# Patient Record
Sex: Female | Born: 1945 | Race: White | Hispanic: No | Marital: Married | Smoking: Former smoker
Health system: Southern US, Community
[De-identification: ages and names within clinical notes are randomized; demographics above are authoritative.]

---

## 1997-09-08 ENCOUNTER — Ambulatory Visit (HOSPITAL_COMMUNITY): Admission: RE | Admit: 1997-09-08 | Discharge: 1997-09-08 | Payer: Self-pay | Admitting: Family Medicine

## 2000-05-03 ENCOUNTER — Ambulatory Visit (HOSPITAL_COMMUNITY): Admission: RE | Admit: 2000-05-03 | Discharge: 2000-05-03 | Payer: Self-pay | Admitting: Family Medicine

## 2000-05-03 ENCOUNTER — Encounter: Payer: Self-pay | Admitting: Family Medicine

## 2002-10-08 ENCOUNTER — Encounter: Payer: Self-pay | Admitting: Family Medicine

## 2002-10-08 ENCOUNTER — Encounter: Admission: RE | Admit: 2002-10-08 | Discharge: 2002-10-08 | Payer: Self-pay | Admitting: Family Medicine

## 2003-08-04 ENCOUNTER — Other Ambulatory Visit: Admission: RE | Admit: 2003-08-04 | Discharge: 2003-08-04 | Payer: Self-pay | Admitting: Obstetrics and Gynecology

## 2005-02-06 ENCOUNTER — Encounter: Admission: RE | Admit: 2005-02-06 | Discharge: 2005-02-06 | Payer: Self-pay | Admitting: Family Medicine

## 2005-02-21 ENCOUNTER — Encounter: Admission: RE | Admit: 2005-02-21 | Discharge: 2005-02-21 | Payer: Self-pay | Admitting: Family Medicine

## 2007-05-13 ENCOUNTER — Encounter: Admission: RE | Admit: 2007-05-13 | Discharge: 2007-05-13 | Payer: Self-pay | Admitting: Family Medicine

## 2008-07-03 ENCOUNTER — Ambulatory Visit (HOSPITAL_BASED_OUTPATIENT_CLINIC_OR_DEPARTMENT_OTHER): Admission: RE | Admit: 2008-07-03 | Discharge: 2008-07-03 | Payer: Self-pay | Admitting: Family Medicine

## 2008-07-03 ENCOUNTER — Ambulatory Visit: Payer: Self-pay | Admitting: Diagnostic Radiology

## 2009-08-25 ENCOUNTER — Ambulatory Visit: Payer: Self-pay | Admitting: Radiology

## 2009-08-25 ENCOUNTER — Ambulatory Visit (HOSPITAL_BASED_OUTPATIENT_CLINIC_OR_DEPARTMENT_OTHER): Admission: RE | Admit: 2009-08-25 | Discharge: 2009-08-25 | Payer: Self-pay | Admitting: Family Medicine

## 2010-04-16 ENCOUNTER — Encounter: Payer: Self-pay | Admitting: Family Medicine

## 2011-07-03 ENCOUNTER — Other Ambulatory Visit: Payer: Self-pay | Admitting: Family Medicine

## 2011-07-03 DIAGNOSIS — Z1231 Encounter for screening mammogram for malignant neoplasm of breast: Secondary | ICD-10-CM

## 2011-07-04 ENCOUNTER — Ambulatory Visit
Admission: RE | Admit: 2011-07-04 | Discharge: 2011-07-04 | Disposition: A | Discharge status: Home / Self Care | Payer: BC Managed Care – PPO | Source: Ambulatory Visit | Attending: Family Medicine | Admitting: Family Medicine

## 2011-07-04 DIAGNOSIS — Z1231 Encounter for screening mammogram for malignant neoplasm of breast: Secondary | ICD-10-CM

## 2012-12-02 ENCOUNTER — Other Ambulatory Visit: Payer: Self-pay | Admitting: Family Medicine

## 2012-12-02 ENCOUNTER — Other Ambulatory Visit: Payer: Self-pay | Admitting: *Deleted

## 2012-12-02 DIAGNOSIS — N644 Mastodynia: Secondary | ICD-10-CM

## 2012-12-04 ENCOUNTER — Ambulatory Visit
Admission: RE | Admit: 2012-12-04 | Discharge: 2012-12-04 | Disposition: A | Discharge status: Home / Self Care | Payer: Medicare Other | Source: Ambulatory Visit | Attending: Family Medicine | Admitting: Family Medicine

## 2012-12-04 ENCOUNTER — Other Ambulatory Visit: Payer: Self-pay | Admitting: Family Medicine

## 2012-12-04 DIAGNOSIS — N644 Mastodynia: Secondary | ICD-10-CM

## 2015-08-05 DIAGNOSIS — I10 Essential (primary) hypertension: Secondary | ICD-10-CM

## 2015-08-05 DIAGNOSIS — R7303 Prediabetes: Secondary | ICD-10-CM

## 2015-08-05 DIAGNOSIS — F419 Anxiety disorder, unspecified: Secondary | ICD-10-CM

## 2015-08-05 HISTORY — DX: Essential (primary) hypertension: I10

## 2015-08-05 HISTORY — DX: Anxiety disorder, unspecified: F41.9

## 2015-08-05 HISTORY — DX: Prediabetes: R73.03

## 2016-01-31 DIAGNOSIS — K219 Gastro-esophageal reflux disease without esophagitis: Secondary | ICD-10-CM

## 2016-01-31 HISTORY — DX: Gastro-esophageal reflux disease without esophagitis: K21.9

## 2016-08-14 DIAGNOSIS — N183 Chronic kidney disease, stage 3 unspecified: Secondary | ICD-10-CM

## 2016-08-14 HISTORY — DX: Chronic kidney disease, stage 3 unspecified: N18.30

## 2016-08-28 DIAGNOSIS — E559 Vitamin D deficiency, unspecified: Secondary | ICD-10-CM

## 2016-08-28 HISTORY — DX: Vitamin D deficiency, unspecified: E55.9

## 2019-05-25 ENCOUNTER — Ambulatory Visit: Payer: Medicare Other | Attending: Internal Medicine

## 2019-05-25 DIAGNOSIS — Z23 Encounter for immunization: Secondary | ICD-10-CM | POA: Insufficient documentation

## 2019-05-25 NOTE — Progress Notes (Signed)
   Covid-19 Vaccination Clinic  Name:  Monica Rivers    MRN: [id number] DOB: 1946/02/12  05/25/2019  [name] was observed post Covid-19 immunization for 15 minutes without incidence. She was provided with Vaccine Information Sheet and instruction to access the V-Safe system.   [name] was instructed to call 911 with any severe reactions post vaccine: Marland Kitchen Difficulty breathing  . Swelling of your face and throat  . A fast heartbeat  . A bad rash all over your body  . Dizziness and weakness    Immunizations Administered    Name Date Dose VIS Date Route   Pfizer COVID-19 Vaccine 05/25/2019 11:58 AM 0.3 mL 03/07/2019 Intramuscular   Manufacturer: [place]   Lot: [id number]   [place]: [id number]

## 2019-06-24 ENCOUNTER — Ambulatory Visit: Payer: Medicare Other | Attending: Internal Medicine

## 2019-06-24 DIAGNOSIS — Z23 Encounter for immunization: Secondary | ICD-10-CM

## 2019-06-24 NOTE — Progress Notes (Signed)
   Covid-19 Vaccination Clinic  Name:  Monica Rivers    MRN: [id number] DOB: 11/19/1945  06/24/2019  [name] was observed post Covid-19 immunization for 15 minutes without incident. She was provided with Vaccine Information Sheet and instruction to access the V-Safe system.   [name] was instructed to call 911 with any severe reactions post vaccine: Marland Kitchen Difficulty breathing  . Swelling of face and throat  . A fast heartbeat  . A bad rash all over body  . Dizziness and weakness   Immunizations Administered    Name Date Dose VIS Date Route   Pfizer COVID-19 Vaccine 06/24/2019 12:08 PM 0.3 mL 03/07/2019 Intramuscular   Manufacturer: [place]   Lot: [id number]   [place]: [id number]

## 2020-01-05 DIAGNOSIS — Z5329 Procedure and treatment not carried out because of patient's decision for other reasons: Secondary | ICD-10-CM | POA: Insufficient documentation

## 2020-01-05 DIAGNOSIS — Z5320 Procedure and treatment not carried out because of patient's decision for unspecified reasons: Secondary | ICD-10-CM | POA: Insufficient documentation

## 2020-07-19 ENCOUNTER — Inpatient Hospital Stay (HOSPITAL_COMMUNITY)
Admission: EM | Admit: 2020-07-19 | Discharge: 2020-07-21 | DRG: 063 | Disposition: A | Discharge status: Home / Self Care | Payer: Medicare Other | Attending: Neurology | Admitting: Neurology

## 2020-07-19 ENCOUNTER — Other Ambulatory Visit: Payer: Self-pay

## 2020-07-19 ENCOUNTER — Emergency Department (HOSPITAL_COMMUNITY): Payer: Medicare Other

## 2020-07-19 ENCOUNTER — Inpatient Hospital Stay (HOSPITAL_COMMUNITY): Payer: Medicare Other

## 2020-07-19 DIAGNOSIS — K219 Gastro-esophageal reflux disease without esophagitis: Secondary | ICD-10-CM | POA: Diagnosis present

## 2020-07-19 DIAGNOSIS — Z87891 Personal history of nicotine dependence: Secondary | ICD-10-CM

## 2020-07-19 DIAGNOSIS — R29701 NIHSS score 1: Secondary | ICD-10-CM | POA: Diagnosis present

## 2020-07-19 DIAGNOSIS — I639 Cerebral infarction, unspecified: Secondary | ICD-10-CM

## 2020-07-19 DIAGNOSIS — I6389 Other cerebral infarction: Secondary | ICD-10-CM | POA: Diagnosis not present

## 2020-07-19 DIAGNOSIS — E1122 Type 2 diabetes mellitus with diabetic chronic kidney disease: Secondary | ICD-10-CM | POA: Diagnosis present

## 2020-07-19 DIAGNOSIS — I129 Hypertensive chronic kidney disease with stage 1 through stage 4 chronic kidney disease, or unspecified chronic kidney disease: Secondary | ICD-10-CM | POA: Diagnosis present

## 2020-07-19 DIAGNOSIS — Z8673 Personal history of transient ischemic attack (TIA), and cerebral infarction without residual deficits: Secondary | ICD-10-CM | POA: Diagnosis not present

## 2020-07-19 DIAGNOSIS — R471 Dysarthria and anarthria: Secondary | ICD-10-CM | POA: Diagnosis present

## 2020-07-19 DIAGNOSIS — Z20822 Contact with and (suspected) exposure to covid-19: Secondary | ICD-10-CM | POA: Diagnosis present

## 2020-07-19 DIAGNOSIS — H510 Palsy (spasm) of conjugate gaze: Secondary | ICD-10-CM | POA: Diagnosis present

## 2020-07-19 DIAGNOSIS — I6381 Other cerebral infarction due to occlusion or stenosis of small artery: Principal | ICD-10-CM | POA: Diagnosis present

## 2020-07-19 DIAGNOSIS — N183 Chronic kidney disease, stage 3 unspecified: Secondary | ICD-10-CM | POA: Diagnosis present

## 2020-07-19 HISTORY — DX: Cerebral infarction, unspecified: I63.9

## 2020-07-19 LAB — COMPREHENSIVE METABOLIC PANEL
ALT: 45 U/L — ABNORMAL HIGH (ref 0–44)
AST: 43 U/L — ABNORMAL HIGH (ref 15–41)
Albumin: 4 g/dL (ref 3.5–5.0)
Alkaline Phosphatase: 56 U/L (ref 38–126)
Anion gap: 8 (ref 5–15)
BUN: 22 mg/dL (ref 8–23)
CO2: 27 mmol/L (ref 22–32)
Calcium: 10.1 mg/dL (ref 8.9–10.3)
Chloride: 102 mmol/L (ref 98–111)
Creatinine, Ser: 1 mg/dL (ref 0.44–1.00)
GFR, Estimated: 59 mL/min — ABNORMAL LOW (ref >60)
Glucose, Bld: 124 mg/dL — ABNORMAL HIGH (ref 70–99)
Potassium: 4.8 mmol/L (ref 3.5–5.1)
Sodium: 137 mmol/L (ref 135–145)
Total Bilirubin: 1.1 mg/dL (ref 0.3–1.2)
Total Protein: 6.6 g/dL (ref 6.5–8.1)

## 2020-07-19 LAB — CBC
HCT: 53.7 % — ABNORMAL HIGH (ref 36.0–46.0)
Hemoglobin: 18.2 g/dL — ABNORMAL HIGH (ref 12.0–15.0)
MCH: 30.5 pg (ref 26.0–34.0)
MCHC: 33.9 g/dL (ref 30.0–36.0)
MCV: 90.1 fL (ref 80.0–100.0)
Platelets: 159 10*3/uL (ref 150–400)
RBC: 5.96 MIL/uL — ABNORMAL HIGH (ref 3.87–5.11)
RDW: 13.8 % (ref 11.5–15.5)
WBC: 6.8 10*3/uL (ref 4.0–10.5)
nRBC: 0 % (ref 0.0–0.2)

## 2020-07-19 LAB — RESP PANEL BY RT-PCR (FLU A&B, COVID) ARPGX2
Influenza A by PCR: NEGATIVE
Influenza B by PCR: NEGATIVE
SARS Coronavirus 2 by RT PCR: NEGATIVE

## 2020-07-19 LAB — I-STAT CHEM 8, ED
BUN: 32 mg/dL — ABNORMAL HIGH (ref 8–23)
Calcium, Ion: 1.24 mmol/L (ref 1.15–1.40)
Chloride: 101 mmol/L (ref 98–111)
Creatinine, Ser: 0.9 mg/dL (ref 0.44–1.00)
Glucose, Bld: 123 mg/dL — ABNORMAL HIGH (ref 70–99)
HCT: 52 % — ABNORMAL HIGH (ref 36.0–46.0)
Hemoglobin: 17.7 g/dL — ABNORMAL HIGH (ref 12.0–15.0)
Potassium: 4.7 mmol/L (ref 3.5–5.1)
Sodium: 139 mmol/L (ref 135–145)
TCO2: 32 mmol/L (ref 22–32)

## 2020-07-19 LAB — ETHANOL: Alcohol, Ethyl (B): <10 mg/dL (ref <10)

## 2020-07-19 LAB — DIFFERENTIAL
Abs Immature Granulocytes: 0.01 10*3/uL (ref 0.00–0.07)
Basophils Absolute: 0.1 10*3/uL (ref 0.0–0.1)
Basophils Relative: 1 %
Eosinophils Absolute: 0.3 10*3/uL (ref 0.0–0.5)
Eosinophils Relative: 4 %
Immature Granulocytes: 0 %
Lymphocytes Relative: 39 %
Lymphs Abs: 2.6 10*3/uL (ref 0.7–4.0)
Monocytes Absolute: 0.8 10*3/uL (ref 0.1–1.0)
Monocytes Relative: 11 %
Neutro Abs: 3.1 10*3/uL (ref 1.7–7.7)
Neutrophils Relative %: 45 %

## 2020-07-19 LAB — CBG MONITORING, ED: Glucose-Capillary: 130 mg/dL — ABNORMAL HIGH (ref 70–99)

## 2020-07-19 LAB — PROTIME-INR
INR: 1 (ref 0.8–1.2)
Prothrombin Time: 12.8 seconds (ref 11.4–15.2)

## 2020-07-19 LAB — APTT: aPTT: 25 seconds (ref 24–36)

## 2020-07-19 MED ORDER — ACETAMINOPHEN 325 MG PO TABS
650.0000 mg | ORAL_TABLET | Freq: Four times a day (QID) | ORAL | Status: DC | PRN
  Administered 2020-07-19: 650 mg via ORAL
  Filled 2020-07-19: qty 2

## 2020-07-19 MED ORDER — SENNOSIDES-DOCUSATE SODIUM 8.6-50 MG PO TABS: 1.0000 | ORAL_TABLET | Freq: Every evening | ORAL | Status: DC | PRN

## 2020-07-19 MED ORDER — IOHEXOL 350 MG/ML SOLN
75.0000 mL | Freq: Once | INTRAVENOUS | Status: AC | PRN
  Administered 2020-07-19: 75 mL via INTRAVENOUS

## 2020-07-19 MED ORDER — LABETALOL HCL 5 MG/ML IV SOLN: 20.0000 mg | Freq: Once | INTRAVENOUS | Status: DC

## 2020-07-19 MED ORDER — ALTEPLASE (STROKE) FULL DOSE INFUSION
0.9000 mg/kg | Freq: Once | INTRAVENOUS | Status: AC
  Administered 2020-07-19: 71.1 mg via INTRAVENOUS
  Filled 2020-07-19: qty 100

## 2020-07-19 MED ORDER — SODIUM CHLORIDE 0.9 % IV SOLN
50.0000 mL | Freq: Once | INTRAVENOUS | Status: AC
  Administered 2020-07-19: 50 mL via INTRAVENOUS

## 2020-07-19 MED ORDER — CLEVIDIPINE BUTYRATE 0.5 MG/ML IV EMUL: 0.0000 mg/h | INTRAVENOUS | Status: DC

## 2020-07-19 MED ORDER — SODIUM CHLORIDE 0.9 % IV SOLN: INTRAVENOUS | Status: DC

## 2020-07-19 MED ORDER — ACETAMINOPHEN 650 MG RE SUPP
650.0000 mg | RECTAL | Status: DC | PRN
Start: 2020-07-19 — End: 2020-07-21

## 2020-07-19 MED ORDER — LORAZEPAM 1 MG PO TABS
1.0000 mg | ORAL_TABLET | Freq: Once | ORAL | Status: AC | PRN
  Administered 2020-07-19: 1 mg via ORAL
  Filled 2020-07-19: qty 1

## 2020-07-19 MED ORDER — ACETAMINOPHEN 325 MG PO TABS
650.0000 mg | ORAL_TABLET | ORAL | Status: DC | PRN
  Administered 2020-07-20 (×2): 650 mg via ORAL
  Filled 2020-07-19 (×2): qty 2

## 2020-07-19 MED ORDER — STROKE: EARLY STAGES OF RECOVERY BOOK
Freq: Once | Status: AC
  Filled 2020-07-19 (×2): qty 1

## 2020-07-19 MED ORDER — PANTOPRAZOLE SODIUM 40 MG IV SOLR
40.0000 mg | Freq: Every day | INTRAVENOUS | Status: DC
  Administered 2020-07-19 – 2020-07-20 (×2): 40 mg via INTRAVENOUS
  Filled 2020-07-19 (×2): qty 40

## 2020-07-19 MED ORDER — ACETAMINOPHEN 160 MG/5ML PO SOLN: 650.0000 mg | ORAL | Status: DC | PRN

## 2020-07-19 NOTE — ED Notes (Signed)
Attempted report at this time.

## 2020-07-19 NOTE — ED Notes (Signed)
Pt unable to go to MRI at this time. Suffers from claustrophobia. Paged admitting requesting medication.

## 2020-07-19 NOTE — H&P (Signed)
Neurology H&P  CC: Code stroke  History is obtained from: patient, EMS  HPI: Monica Rivers is a 75 yo female with a PMHx of HTN, GERD, CKD III, Vitamin D deficiency, tobacco abuse, anxiety, and "pre" DM II with last HbA1c 6.1% 3/22. Patient presents by EMS for acute symptoms of blurred vision, slurred speech, and dizziness that started at 0945 hours today and prior to that, she was normal as seen by her husband. Her symptoms resolved at 1200 hours with the exception of vision problems. Patient is on no blood thinners or anti platelets. No personal history or FMHx of stroke.   After brief exam on the ED bridge, patient was taken emergently to the CT suite. NIHSS 1 gaze abnormality; patient had new complete bilateral vertical gaze palsy. She also reported persistent dizziness. CTH without acute finding. CTA head and neck negative for LVO. Given patient's symptoms and the open window for tPA, consent and checklist were reviewed with patient by Dr. Quinn Axe. Patient consented and tPA was started.   On exam in ED room after CTs, patient had 2 complaints. A HA, dull, aggravating but not severe to her forehead and occipital areas. + hx of MHAs associated with menses which did not continue after menopause. The other issue is blurred vision described as not being able to focus which is worse with both eyes open. Much less blurriness when one or the other eye is covered. This is new for her. She has a history of vertigo but has only had one really bad spell with this once. She vomited with that episode. She says that occasionally she will get dizzy, described as the room spinning, if she bends over and stands up quickly. No syncope.   In review of chart, her last OV at [facility] showed a HbA1c of 6.1%. Could not locate a lipid profile, but she was prescribed Atorvastatin but never took it. Note states that she has declined statins.    LKW: [id number] tpa given?: Yes IR Thrombectomy? No, no LVO MRS 0  NIHSS:  1a Level of  Conscious: 0 1b LOC Questions: 0 1c LOC Commands: 0 2 Best Gaze: 1 3 Visual: 0 4 Facial Palsy: 0 5a Motor Arm - left: 0 5b Motor Arm - Right:0  6a Motor Leg - Left: 0 6b Motor Leg - Right: 0 7 Limb Ataxia: 0 8 Sensory: 0 9 Best Language: 0 10 Dysarthria: 0 11 Extinct. and Inatten: 0 TOTAL:  1  ROS: A robust ROS was performed and is negative except as noted in the HPI.  No dizziness, visual disturbance, or slurred speech on arrival.   No family history on file.  Social History: smokes     PTA medications: Losartan, Vit D, Ativan, PPI prn,   Exam: Current vital signs: BP 134/68   HR 66   RR 14   SaO2 96%  Physical Exam  Constitutional: Appears well-developed and well-nourished.  Psych: Affect appropriate to situation Eyes: No scleral injection. She is moving her eyes around a lot (trying to correct her vision) HENT: No OP obstrucion Head: Normocephalic.  Cardiovascular: Normal rate and regular rhythm.  Respiratory: Effort normal. Lungs CTA.  GI: Soft.  No distension. There is no tenderness.  Skin: WDI  Neuro: Mental Status: Patient is awake, alert, oriented to person, place, month, year, and situation. Patient is able to give a clear and coherent history. No signs of aphasia or neglect Speech/Language: Speech is fluent, repetition and naming intact. Comprehension intact. No dysarthria or  aphasia.  Cranial Nerves: II: Visual Fields are full. Pupils are equal, round, and reactive to light.  III,IV, VI: bilateral vertical gaze palsy, horizontal gaze movements intact V: Facial sensation is symmetric to light touch.  VII: Facial movement is symmetric.  VIII: hearing is intact to voice X: Uvula elevates symmetrically XI: Shoulder shrug is symmetric. XII: tongue is midline without atrophy or fasciculations.  Motor: Tone is normal. Bulk is normal. 5/5 strength was present in all four extremities.  Sensory: Sensation is symmetric to light touch in the arms and legs.   Plantars: Toes are downgoing bilaterally.  Cerebellar: FNF and HKS are intact bilaterally.   I have reviewed labs in epic and the pertinent results are: INR  1.0    APTT 25    Glucose 123     HbA1c 6.1% last month at PCP  MD reviewed the images obtained:  NCT head  There is no acute intracranial hemorrhage or evidence of acute infarction. ASPECT score is 10. Chronic microvascular ischemic changes.  CTA head and neck  No large vessel occlusion, hemodynamically significant stenosis, or evidence of dissection.  Assessment: 75 yo woman presented as stroke code for acute onset dizziness, slurred speech, and vision problem with LKW 0945 today. Sx improved by 1200 although she still had a complete bilateral gaze palsy concerning for midbrain infarct. This in addition to the persistent vertigo were felt to be clinically significant despite a technically low NIHSS of 1. Patient met inclusion criteria and did not meet any exclusion criteria for tPA, and tPA was administered after patient gave informed consent. CTA showed no LVO and thus no indication for acute intervention. Stroke risk factors include HTN, tobacco abuse, and pre DM II. She will be started on anti platelet and ASA tomorrow per stroke team if appropriate and after 24 hour bleeding precaution window after tPA administration.    #TIA vs Stroke s/p tPA. - Admit to [facility] 3w stepdown under Dr. Quinn Axe - Post-thrombolytic monitoring: OPTIMIST main protocol - Manage Blood Pressure per post Thrombolytic protocol. Avoid oral antihypertensives - CT brain 24 hours post Thrombolytic - NPO until swallowing screen performed and passed - No antiplatelet agents or anticoagulants (including heparin for DVT prophylaxis) in first 24 hours. If 24 hr head CT negative stroke team will start on ASA 33m daily +/- plavix 733mdaily tomorrow. - No Foley catheter, nasogastric tube, arterial catheter or central venous catheter for 24 hr, unless absolutely  necessary - Telemetry - MRI brain wo - TTE w/ bubble - Check A1c and LDL + add statin per guidelines - q4 hr neuro checks - STAT head CT for any change in neuro exam - PT/OT/SLP - Stroke education - Amb referral to neurology upon discharge   Stroke team will assume care tomorrow.    This patient is critically ill and at significant risk of neurological worsening, death and care requires constant monitoring of vital signs, hemodynamics,respiratory and cardiac monitoring, neurological assessment, discussion with family, other specialists and medical decision making of high complexity. I spent 75 minutes of neurocritical care time  in the care of  this patient. This was time spent independent of any time provided by nurse practitioner or PA.  CoSu MonksMD [facility] [phone] 7pm- 7am, please page neurology on call as listed in [place] Electronically signed by: Monica BollMSN, APN-BC, nurse practitioner and edited by me to reflect my findings and recommendations.

## 2020-07-19 NOTE — Code Documentation (Signed)
Stroke Response Nurse Documentation Code Documentation  Monica Rivers is a 75 y.o. female arriving to [facility] ED via [facility] EMS on 07/19/2020. Code stroke was activated by EMS. Patient from home where she was LKW at [place] and now complaining of blurred vision with trouble seeing clearly along with resolved slurred speech.   Stroke team at the bedside on patient arrival. Labs drawn and patient cleared for CT. Patient to CT with team. NIHSS 0, see documentation for details and code stroke times. Patient only had vertical gaze palsy noted on exam. Pt reports double vision and "things pieced together." The following imaging was completed:  CT, CTA head and neck. Patient is a candidate for tPA due to debilitating symptoms and within the window. tPA started in CT.  Care/Plan: Potential low-intensity monitoring patient: q15 mNIHSS/VS x2 hours and call for potential orders. BP < 180/105. Admit to [address]. Bedside handoff with ED RN Camryn.    Kathrin Greathouse  Stroke Response RN

## 2020-07-19 NOTE — ED Notes (Signed)
Patient transported to MRI 

## 2020-07-19 NOTE — ED Provider Notes (Signed)
[place] EMERGENCY DEPARTMENT Provider Note   Monica Rivers: [id number] Arrival date & time: 07/19/20  1305     History No chief complaint on file.   CODA FILLER is a 75 y.o. female.  75 year old female with prior medical history as detailed below presents for evaluation.  Patient was analysis a code stroke by EMS.  Patient was last known normal at 945 this morning.  Patient was noted by husband to have slurred speech, decreased vision, and mild confusion around 11am.  Patient was met by the neuro team on arrival.  Patient was taken directly to CT.  Patient meets criteria for tPA.  The history is provided by the patient and medical records.  Illness Location:  Acute vision change, slurred speech Severity:  Moderate Onset quality:  Sudden Timing:  Unable to specify Progression:  Improving Chronicity:  New      No past medical history on file.  There are no problems to display for this patient.    OB History   No obstetric history on file.     No family history on file.     Home Medications Prior to Admission medications   Not on File    Allergies    Patient has no allergy information on record.  Review of Systems   Review of Systems  All other systems reviewed and are negative.   Physical Exam Updated Vital Signs BP (!) 141/63   Pulse 81   Temp 97.9 F (36.6 C) (Oral)   Resp 15   Ht _0  (1.6 m) Comment: from 2020  Wt 79 kg   SpO2 97%   BMI 30.85 kg/m   Physical Exam Vitals and nursing note reviewed.  Constitutional:      General: She is not in acute distress.    Appearance: Normal appearance. She is well-developed.  HENT:     Head: Normocephalic and atraumatic.  Eyes:     Conjunctiva/sclera: Conjunctivae normal.     Pupils: Pupils are equal, round, and reactive to light.  Cardiovascular:     Rate and Rhythm: Normal rate and regular rhythm.     Heart sounds: Normal heart sounds.  Pulmonary:     Effort: Pulmonary effort  is normal. No respiratory distress.     Breath sounds: Normal breath sounds.  Abdominal:     General: There is no distension.     Palpations: Abdomen is soft.     Tenderness: There is no abdominal tenderness.  Musculoskeletal:        General: No deformity. Normal range of motion.     Cervical back: Normal range of motion and neck supple.  Skin:    General: Skin is warm and dry.  Neurological:     Mental Status: She is alert and oriented to person, place, and time.     ED Results / Procedures / Treatments   Labs (all labs ordered are listed, but only abnormal results are displayed) Labs Reviewed  CBC - Abnormal; Notable for the following components:      Result Value   RBC 5.96 (*)    Hemoglobin 18.2 (*)    HCT 53.7 (*)    All other components within normal limits  CBG MONITORING, ED - Abnormal; Notable for the following components:   Glucose-Capillary 130 (*)    All other components within normal limits  I-STAT CHEM 8, ED - Abnormal; Notable for the following components:   BUN 32 (*)    Glucose, Bld 123 (*)  Hemoglobin 17.7 (*)    HCT 52.0 (*)    All other components within normal limits  RESP PANEL BY RT-PCR (FLU A&B, COVID) [id number]  PROTIME-INR  APTT  DIFFERENTIAL  ETHANOL  COMPREHENSIVE METABOLIC PANEL  RAPID URINE DRUG SCREEN, HOSP PERFORMED  URINALYSIS, ROUTINE W REFLEX MICROSCOPIC    EKG None  Radiology CT HEAD CODE STROKE WO CONTRAST  Result Date: 07/19/2020 CLINICAL DATA:  Code stroke.  Dizzy, visual changes EXAM: CT HEAD WITHOUT CONTRAST TECHNIQUE: Contiguous axial images were obtained from the base of the skull through the vertex without intravenous contrast. COMPARISON:  None. FINDINGS: Brain: There is no acute intracranial hemorrhage, mass effect, or edema. No acute appearing loss of gray-white differentiation. Patchy and confluent areas of hypoattenuation in the supratentorial white matter are nonspecific but may reflect mild to moderate chronic  microvascular ischemic changes. Ventricles and sulci are within normal limits in size and configuration. No extra-axial collection. Vascular: No hyperdense vessel. There is minima intracranial atherosclerotic calcification at the skull base. Skull: Unremarkable. Sinuses/Orbits: No acute abnormality. Other: Mastoid air cells are clear. ASPECTS ([place] Stroke Program Early CT Score) - Ganglionic level infarction (caudate, lentiform nuclei, internal capsule, insula, M1-M3 cortex): 7 - Supraganglionic infarction (M4-M6 cortex): 3 Total score (0-10 with 10 being normal): 10 IMPRESSION: There is no acute intracranial hemorrhage or evidence of acute infarction. ASPECT score is 10. Chronic microvascular ischemic changes. Initial results were communicated to Dr. Quinn Axe at 1:26 pm on 07/19/2020 by text page via the [facility] messaging system. Electronically Signed   By: Macy Mis M.D.   On: 07/19/2020 13:34    Procedures Procedures  CRITICAL CARE Performed by: Valarie Merino   Total critical care time: 30 minutes  Critical care time was exclusive of separately billable procedures and treating other patients.  Critical care was necessary to treat or prevent imminent or life-threatening deterioration.  Critical care was time spent personally by me on the following activities: development of treatment plan with patient and/or surrogate as well as nursing, discussions with consultants, evaluation of patient's response to treatment, examination of patient, obtaining history from patient or surrogate, ordering and performing treatments and interventions, ordering and review of laboratory studies, ordering and review of radiographic studies, pulse oximetry and re-evaluation of patient's condition.   Medications Ordered in ED Medications  iohexol (OMNIPAQUE) 350 MG/ML injection 75 mL (75 mLs Intravenous Contrast Given 07/19/20 1337)    ED Course  I have reviewed the triage vital signs and the nursing  notes.  Pertinent labs & imaging results that were available during my care of the patient were reviewed by me and considered in my medical decision making (see chart for details).    MDM Rules/Calculators/A&P                          MDM  MSE complete  SKYLA CHAMPAGNE was evaluated in Emergency Department on 07/19/2020 for the symptoms described in the history of present illness. She was evaluated in the context of the global COVID-19 pandemic, which necessitated consideration that the patient might be at risk for infection with the SARS-CoV-2 virus that causes COVID-19. Institutional protocols and algorithms that pertain to the evaluation of patients at risk for COVID-19 are in a state of rapid change based on information released by regulatory bodies including the CDC and federal and state organizations. These policies and algorithms were followed during the patient's care in the ED.   Patient is  presenting as code stroke for presumed acute CVA.  Patient was met by neurology team on arrival.  She met criteria for tPA administration.  Patient will be admitted to the neuro service for further work-up and treatment.   Final Clinical Impression(s) / ED Diagnoses Final diagnoses:  Cerebrovascular accident (CVA), unspecified mechanism ([place])    Rx / DC Orders ED Discharge Orders    None       Valarie Merino, MD 07/19/20 1353

## 2020-07-19 NOTE — ED Notes (Signed)
Paged Dr. Su Monks, Admitting Doctor, for RN Mel Almond

## 2020-07-19 NOTE — Progress Notes (Signed)
PHARMACIST CODE STROKE RESPONSE  Notified to mix tPA at 13:18 by Dr. Quinn Axe Delivered tPA to RN at 1:21  tPA dose = 7.1 mg bolus over 1 minute followed by 64 mg for a total dose of 71.1 mg over 1 hour  Issues/delays encountered (if applicable): N/a  Monica Rivers, PharmD PGY1 [place] Resident  07/19/2020 2:09 PM  Please check [email or web address] for unit-specific pharmacy phone numbers.

## 2020-07-19 NOTE — ED Notes (Signed)
Help get patient undressed on the monitor did ekg shown to Dr Messick patient is resting with nurse at bedside 

## 2020-07-19 NOTE — Research (Signed)
Patient meets criteria for low-intensity monitoring. Orders placed and patient evaluated.   Spoke with patient about OPTMISTmain trial and being a candidate for the research. Pt made aware of data collection, discharge questions, and 90-day follow-up. Verbalizes understanding and agreed to move forward with research. Patient Information Sheet given to patient and family. All questions answered.

## 2020-07-19 NOTE — ED Triage Notes (Signed)
Pt arrives as code stroke from home via EMS, woke up feeling totally normal this morning at 0730. At 0945 pt developed dizziness, visual changes, and slurred speech. Pt did not notice slurred speech but spouse endorses such. Slurred speech and dizziness resolved on EMS arrival, but visual changes remain. A/O x 4.

## 2020-07-20 ENCOUNTER — Inpatient Hospital Stay (HOSPITAL_COMMUNITY): Payer: Medicare Other

## 2020-07-20 DIAGNOSIS — I6389 Other cerebral infarction: Secondary | ICD-10-CM

## 2020-07-20 LAB — RAPID URINE DRUG SCREEN, HOSP PERFORMED
Amphetamines: NOT DETECTED
Barbiturates: NOT DETECTED
Benzodiazepines: NOT DETECTED
Cocaine: NOT DETECTED
Opiates: NOT DETECTED
Tetrahydrocannabinol: NOT DETECTED

## 2020-07-20 LAB — ECHOCARDIOGRAM COMPLETE
AR max vel: 2.16 cm2
AV Area VTI: 2.03 cm2
AV Area mean vel: 1.85 cm2
AV Mean grad: 4 mmHg
AV Peak grad: 8.1 mmHg
Ao pk vel: 1.42 m/s
Area-P 1/2: 4.31 cm2
Calc EF: 58.9 %
Height: 63 in
MV M vel: 4.89 m/s
MV Peak grad: 95.6 mmHg
MV VTI: 2.13 cm2
S' Lateral: 2.5 cm
Single Plane A2C EF: 47.7 %
Single Plane A4C EF: 64.4 %
Weight: 2786.61 oz

## 2020-07-20 LAB — LIPID PANEL
Cholesterol: 95 mg/dL (ref 0–200)
HDL: 46 mg/dL (ref >40)
LDL Cholesterol: 40 mg/dL (ref 0–99)
Total CHOL/HDL Ratio: 2.1 RATIO
Triglycerides: 46 mg/dL (ref <150)
VLDL: 9 mg/dL (ref 0–40)

## 2020-07-20 LAB — HEMOGLOBIN A1C
Hgb A1c MFr Bld: 6.2 % — ABNORMAL HIGH (ref 4.8–5.6)
Mean Plasma Glucose: 131.24 mg/dL

## 2020-07-20 MED ORDER — ASPIRIN EC 81 MG PO TBEC
81.0000 mg | DELAYED_RELEASE_TABLET | Freq: Every day | ORAL | Status: DC
  Administered 2020-07-20 – 2020-07-21 (×2): 81 mg via ORAL
  Filled 2020-07-20 (×2): qty 1

## 2020-07-20 MED ORDER — NICOTINE 14 MG/24HR TD PT24
14.0000 mg | MEDICATED_PATCH | Freq: Every day | TRANSDERMAL | Status: DC
  Administered 2020-07-20 – 2020-07-21 (×2): 14 mg via TRANSDERMAL
  Filled 2020-07-20 (×2): qty 1

## 2020-07-20 MED ORDER — CLOPIDOGREL BISULFATE 75 MG PO TABS
75.0000 mg | ORAL_TABLET | Freq: Every day | ORAL | Status: DC
  Administered 2020-07-20 – 2020-07-21 (×2): 75 mg via ORAL
  Filled 2020-07-20 (×2): qty 1

## 2020-07-20 NOTE — Progress Notes (Signed)
*  PRELIMINARY RESULTS* Echocardiogram 2D Echocardiogram has been performed.  Monica Rivers 07/20/2020, 10:04 AM

## 2020-07-20 NOTE — Progress Notes (Signed)
PT Cancellation Note  Patient Details Name: MIASHA EMMONS MRN: [id number] DOB: [birth year]   Cancelled Treatment:    Reason Eval/Treat Not Completed: Active bedrest orderWill follow for updated activity orders.   Leighton Roach, [place]  Pager [phone] Office [place] 07/20/2020, 8:33 AM

## 2020-07-20 NOTE — Plan of Care (Signed)
  Problem: Education: Goal: Knowledge of disease or condition will improve Outcome: Progressing Goal: Knowledge of secondary prevention will improve Outcome: Progressing Goal: Knowledge of patient specific risk factors addressed and post discharge goals established will improve Outcome: Progressing Goal: Individualized Educational Video(s) Outcome: Progressing   

## 2020-07-20 NOTE — Progress Notes (Signed)
OT Cancellation Note  Patient Details Name: Monica Rivers MRN: [id number] DOB: [birth year]   Cancelled Treatment:    Reason Eval/Treat Not Completed: Active bedrest order - pt on strict bedrest, noted TPA received 1324 on 4/25. Will follow and see as able once activity orders are updated.   Jolaine Artist, OT Acute Rehabilitation Services Pager [phone] Office [phone]  Delight Stare 07/20/2020, 8:22 AM

## 2020-07-20 NOTE — Progress Notes (Addendum)
STROKE TEAM PROGRESS NOTE   INTERVAL HISTORY Her niece (an Therapist, sports in the hospital) is at the bedside.  Patient reports that the only thing troubling her is her eyesight. Patient reports that her vision was worse when she presented but she continues to have trouble. Patient reports that last night she had vertical diplopia and also felt that her eyes were trying to cross and that "everything was in pieces." This AM patient denies vertical diplopia but finds it very hard to focus on things and can feel her eyes struggling to move. Patient notes that when she covers one eye at a time her vision is better.  Patient otherwise doing well since tPA. Bed rest has been removed.  Vitals:   07/19/20 2330 07/20/20 0345 07/20/20 0700 07/20/20 1112  BP: (!) 151/109 132/70 102/60 131/69  Pulse: 63 64 62 66  Resp: 18 18 19 18   Temp: 98.1 F (36.7 C) 97.9 F (36.6 C) 98 F (36.7 C) (!) 97.5 F (36.4 C)  TempSrc: Oral Oral Oral Oral  SpO2: 97% 96% 98% 95%  Weight:      Height:       CBC:  Recent Labs  Lab 07/19/20 1309 07/19/20 1314  WBC 6.8  --   NEUTROABS 3.1  --   HGB 18.2* 17.7*  HCT 53.7* 52.0*  MCV 90.1  --   PLT 159  --    Basic Metabolic Panel:  Recent Labs  Lab 07/19/20 1309 07/19/20 1314  NA 137 139  K 4.8 4.7  CL 102 101  CO2 27  --   GLUCOSE 124* 123*  BUN 22 32*  CREATININE 1.00 0.90  CALCIUM 10.1  --    Lipid Panel:  Recent Labs  Lab 07/20/20 0319  CHOL 95  TRIG 46  HDL 46  CHOLHDL 2.1  VLDL 9  LDLCALC 40   HgbA1c:  Recent Labs  Lab 07/20/20 0319  HGBA1C 6.2*   Urine Drug Screen:  Recent Labs  Lab 07/20/20 1134  LABOPIA NONE DETECTED  COCAINSCRNUR NONE DETECTED  LABBENZ NONE DETECTED  AMPHETMU NONE DETECTED  THCU NONE DETECTED  LABBARB NONE DETECTED    Alcohol Level  Recent Labs  Lab 07/19/20 1309  ETH <10    IMAGING past 24 hours MR BRAIN WO CONTRAST  Result Date: 07/19/2020 CLINICAL DATA:  Neuro deficit, acute, stroke suspected. Additional  history provided: Patient reports dizziness, vision changes, slurred speech. EXAM: MRI HEAD WITHOUT CONTRAST TECHNIQUE: Multiplanar, multiecho pulse sequences of the brain and surrounding structures were obtained without intravenous contrast. COMPARISON:  Noncontrast head CT and CT angiogram head/neck performed earlier today 07/19/2020. FINDINGS: Brain: Mild-to-moderate intermittent motion degradation. Cerebral volume is normal for age. 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 Chronic lacunar infarct within the posterior limb of left internal capsule/basal ganglia. Background moderate multifocal T2/FLAIR hyperintensity within the cerebral white matter, nonspecific but compatible with chronic small vessel ischemic disease. Mild chronic small vessel ischemic changes are also present within the pons. Subcentimeter chronic infarct within the right cerebellar hemisphere. No evidence of intracranial mass. No chronic intracranial blood products. No extra-axial fluid collection. No midline shift. Vascular: Expected proximal arterial flow voids. Skull and upper cervical spine: No focal marrow lesion. Incomplete assessed upper cervical spondylosis. Sinuses/Orbits: Visualized orbits show no acute finding. Prior right lens replacement. Trace mucosal thickening and small mucous retention cyst within the left maxillary sinus. IMPRESSION: Mild-to-moderate intermittent motion degradation. 11 mm acute infarct within  the medial right thalamus, with extension into the upper midbrain. Chronic lacunar infarct within the posterior limb of left internal capsule. Background moderate chronic small vessel ischemic disease. Subcentimeter chronic infarct within the right cerebellar hemisphere. Mild left maxillary paranasal sinus disease, as described. Electronically Signed   By: Kellie Simmering DO   On: 07/19/2020 18:32   ECHOCARDIOGRAM COMPLETE  Result Date: 07/20/2020    ECHOCARDIOGRAM  REPORT   Patient Name:   FAATIMA PEIKERT [facility] Date of Exam: 07/20/2020 Medical Rec #:  [id number]      Height:       63.0 in Accession #:    [id number]     Weight:       174.2 lb Date of Birth:  1945/04/19       BSA:          1.823 m Patient Age:    75 years       BP:           132/70 mmHg Patient Gender: F              HR:           75 bpm. Exam Location:  Inpatient Procedure: 2D Echo, Cardiac Doppler and Color Doppler Indications:    CVA  History:        Patient has no prior history of Echocardiogram examinations.  Sonographer:    Luisa Hart RDCS Referring Phys: [id number] Karsten Fells [facility]  Sonographer Comments: Suboptimal subcostal window. IMPRESSIONS  1. Left ventricular ejection fraction, by estimation, is 60 to 65%. The left ventricle has normal function. The left ventricle has no regional wall motion abnormalities. Left ventricular diastolic parameters are consistent with Grade I diastolic dysfunction (impaired relaxation).  2. Right ventricular systolic function is normal. The right ventricular size is normal. There is normal pulmonary artery systolic pressure.  3. The mitral valve is normal in structure. Trivial mitral valve regurgitation. No evidence of mitral stenosis.  4. The aortic valve is normal in structure. Aortic valve regurgitation is not visualized. No aortic stenosis is present.  5. The inferior vena cava is normal in size with greater than 50% respiratory variability, suggesting right atrial pressure of 3 mmHg. FINDINGS  Left Ventricle: Left ventricular ejection fraction, by estimation, is 60 to 65%. The left ventricle has normal function. The left ventricle has no regional wall motion abnormalities. The left ventricular internal cavity size was normal in size. There is  no left ventricular hypertrophy. Left ventricular diastolic parameters are consistent with Grade I diastolic dysfunction (impaired relaxation). Normal left ventricular filling pressure. Right Ventricle: The right ventricular size is  normal. No increase in right ventricular wall thickness. Right ventricular systolic function is normal. There is normal pulmonary artery systolic pressure. The tricuspid regurgitant velocity is 2.64 m/s, and  with an assumed right atrial pressure of 3 mmHg, the estimated right ventricular systolic pressure is 123456 mmHg. Left Atrium: Left atrial size was normal in size. Right Atrium: Right atrial size was normal in size. Pericardium: There is no evidence of pericardial effusion. Mitral Valve: The mitral valve is normal in structure. Trivial mitral valve regurgitation. No evidence of mitral valve stenosis. MV peak gradient, 4.7 mmHg. The mean mitral valve gradient is 2.0 mmHg. Tricuspid Valve: The tricuspid valve is normal in structure. Tricuspid valve regurgitation is mild . No evidence of tricuspid stenosis. Aortic Valve: The aortic valve is normal in structure. Aortic valve regurgitation is not visualized. No aortic stenosis is present. Aortic valve mean gradient  measures 4.0 mmHg. Aortic valve peak gradient measures 8.1 mmHg. Aortic valve area, by VTI measures 2.03 cm. Pulmonic Valve: The pulmonic valve was normal in structure. Pulmonic valve regurgitation is not visualized. No evidence of pulmonic stenosis. Aorta: The aortic root is normal in size and structure. Venous: The inferior vena cava is normal in size with greater than 50% respiratory variability, suggesting right atrial pressure of 3 mmHg. IAS/Shunts: No atrial level shunt detected by color flow Doppler.  LEFT VENTRICLE PLAX 2D LVIDd:         4.00 cm     Diastology LVIDs:         2.50 cm     LV e' medial:    8.49 cm/s LV PW:         1.00 cm     LV E/e' medial:  10.4 LV IVS:        1.10 cm     LV e' lateral:   9.36 cm/s LVOT diam:     1.90 cm     LV E/e' lateral: 9.5 LV SV:         67 LV SV Index:   37 LVOT Area:     2.84 cm  LV Volumes (MOD) LV vol d, MOD A2C: 26.0 ml LV vol d, MOD A4C: 39.6 ml LV vol s, MOD A2C: 13.6 ml LV vol s, MOD A4C: 14.1 ml LV  SV MOD A2C:     12.4 ml LV SV MOD A4C:     39.6 ml LV SV MOD BP:      20.1 ml RIGHT VENTRICLE RV S prime:     15.10 cm/s TAPSE (M-mode): 2.5 cm LEFT ATRIUM             Index       RIGHT ATRIUM           Index LA diam:        3.10 cm 1.70 cm/m  RA Area:     13.30 cm LA Vol (A2C):   47.3 ml 25.94 ml/m RA Volume:   28.90 ml  15.85 ml/m LA Vol (A4C):   47.5 ml 26.05 ml/m LA Biplane Vol: 47.4 ml 26.00 ml/m  AORTIC VALVE                   PULMONIC VALVE AV Area (Vmax):    2.16 cm    PV Vmax:       0.91 m/s AV Area (Vmean):   1.85 cm    PV Vmean:      60.600 cm/s AV Area (VTI):     2.03 cm    PV VTI:        0.215 m AV Vmax:           142.00 cm/s PV Peak grad:  3.3 mmHg AV Vmean:          99.000 cm/s PV Mean grad:  2.0 mmHg AV VTI:            0.333 m AV Peak Grad:      8.1 mmHg AV Mean Grad:      4.0 mmHg LVOT Vmax:         108.00 cm/s LVOT Vmean:        64.700 cm/s LVOT VTI:          0.238 m LVOT/AV VTI ratio: 0.71  AORTA Ao Root diam: 3.10 cm Ao Asc diam:  2.60 cm MITRAL VALVE  TRICUSPID VALVE MV Area (PHT): 4.31 cm     TR Peak grad:   27.9 mmHg MV Area VTI:   2.13 cm     TR Vmax:        264.00 cm/s MV Peak grad:  4.7 mmHg MV Mean grad:  2.0 mmHg     SHUNTS MV Vmax:       1.08 m/s     Systemic VTI:  0.24 m MV Vmean:      59.8 cm/s    Systemic Diam: 1.90 cm MV Decel Time: 176 msec MR Peak grad: 95.6 mmHg MR Vmax:      489.00 cm/s MV E velocity: 88.60 cm/s MV A velocity: 108.00 cm/s MV E/A ratio:  0.82 Mihai Croitoru MD Electronically signed by Sanda Klein MD Signature Date/Time: 07/20/2020/10:59:08 AM    Final    CT ANGIO HEAD CODE STROKE  Result Date: 07/19/2020 CLINICAL DATA:  Dizziness, visual changes EXAM: CT ANGIOGRAPHY HEAD AND NECK TECHNIQUE: Multidetector CT imaging of the head and neck was performed using the standard protocol during bolus administration of intravenous contrast. Multiplanar CT image reconstructions and MIPs were obtained to evaluate the vascular anatomy. Carotid  stenosis measurements (when applicable) are obtained utilizing NASCET criteria, using the distal internal carotid diameter as the denominator. CONTRAST:  78mL OMNIPAQUE IOHEXOL 350 MG/ML SOLN COMPARISON:  None. FINDINGS: CTA NECK Aortic arch: Great vessel origins are patent. Right carotid system: Patent. No stenosis or evidence of dissection. Left carotid system: Patent.  No stenosis or evidence of dissection. Vertebral arteries: Patent. Left vertebral artery is slightly dominant no stenosis or evidence of dissection. Skeleton: Degenerative changes of the included spine. Other neck: Unremarkable. Upper chest: Mild emphysema. Review of the MIP images confirms the above findings CTA HEAD Anterior circulation: Intracranial internal carotid arteries are patent. There is minimal calcified plaque. Anterior and middle cerebral arteries are patent. Anterior communicating artery is present. Posterior circulation: Intracranial vertebral arteries are patent. There is extradural origin of the left PICA. Basilar artery is patent. Posterior cerebral arteries are patent. A left posterior communicating artery is present and the dominant supply of the PCA. Venous sinuses: Patent as allowed by contrast bolus timing. Review of the MIP images confirms the above findings IMPRESSION: No large vessel occlusion, hemodynamically significant stenosis, or evidence of dissection. Electronically Signed   By: Macy Mis M.D.   On: 07/19/2020 13:44   CT ANGIO NECK CODE STROKE  Result Date: 07/19/2020 CLINICAL DATA:  Dizziness, visual changes EXAM: CT ANGIOGRAPHY HEAD AND NECK TECHNIQUE: Multidetector CT imaging of the head and neck was performed using the standard protocol during bolus administration of intravenous contrast. Multiplanar CT image reconstructions and MIPs were obtained to evaluate the vascular anatomy. Carotid stenosis measurements (when applicable) are obtained utilizing NASCET criteria, using the distal internal carotid  diameter as the denominator. CONTRAST:  40mL OMNIPAQUE IOHEXOL 350 MG/ML SOLN COMPARISON:  None. FINDINGS: CTA NECK Aortic arch: Great vessel origins are patent. Right carotid system: Patent. No stenosis or evidence of dissection. Left carotid system: Patent.  No stenosis or evidence of dissection. Vertebral arteries: Patent. Left vertebral artery is slightly dominant no stenosis or evidence of dissection. Skeleton: Degenerative changes of the included spine. Other neck: Unremarkable. Upper chest: Mild emphysema. Review of the MIP images confirms the above findings CTA HEAD Anterior circulation: Intracranial internal carotid arteries are patent. There is minimal calcified plaque. Anterior and middle cerebral arteries are patent. Anterior communicating artery is present. Posterior circulation: Intracranial vertebral arteries are  patent. There is extradural origin of the left PICA. Basilar artery is patent. Posterior cerebral arteries are patent. A left posterior communicating artery is present and the dominant supply of the PCA. Venous sinuses: Patent as allowed by contrast bolus timing. Review of the MIP images confirms the above findings IMPRESSION: No large vessel occlusion, hemodynamically significant stenosis, or evidence of dissection. Electronically Signed   By: Macy Mis M.D.   On: 07/19/2020 13:44    PHYSICAL EXAM Physical Exam  Constitutional: Appears well-developed and well-nourished.  Psych: Affect appropriate to situation Eyes: No scleral injection. She is moving her eyes around a lot (trying to correct her vision) HENT: No OP obstrucion Head: Normocephalic.  Cardiovascular: Normal rate and regular rhythm.  Respiratory: Effort normal. Lungs CTA.  Skin: WDI  Neuro: Mental Status: Patient is awake, alert, oriented to person, place, month, year, and situation. Patient is able to give a clear and coherent history. No signs of aphasia or neglect Speech/Language: Speech is fluent,  repetition and naming intact. Comprehension intact. No dysarthria or aphasia.  Cranial Nerves: II: Visual Fields are full. Pupils are 39mm equal, round, and reactive to light.  III,IV, VI: bilateral vertical gaze palsy, horizontal gaze movements intact with saccadic corrective movements when looking to the Right. 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 Binocular vertical diplopia noted with up and down gaze mainly. V: Facial sensation is symmetric to light touch.  VII: Facial movement is symmetric.  VIII: hearing is intact to voice X: Uvula elevates symmetrically XI: Shoulder shrug is symmetric. XII: tongue is midline without atrophy or fasciculations.  Motor: Tone is normal. Bulk is normal. 5/5 strength was present in all four extremities.  Sensory: Sensation is symmetric to light touch in the arms and legs.  Plantars: Toes are downgoing bilaterally.  Cerebellar: FNF and HKS are intact bilaterally.   ASSESSMENT/PLAN [name] is a 75 y.o. female with history of HTN, GERD, CKD III, Vitamin D deficiency, tobacco abuse, anxiety, and "pre" DM II with last HbA1c 6.1% 3/22 presenting with rapid onset visual disturbances. MRI Brain noted that patient had a small acute infarct in the medial L thalamus with some extension into the upper midbrain. Patient did receive tPA with NIHSS of 1.  This could explain patient's dysconjugate gaze and vertical gaze palsy. Patient has never had a CVA before but reported that she has severe GERD. Patient agreed to take ASA coated tablet for 3 weeks with Plavix but will then take Plavix alone to decrease gastric irritation. Patient echo was neg for structural abnormalities that could increase risk for CVA. Patient CT A Head and neck was negative for LVO and/or significant stenosis.   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 ASPECTS 10.      CTA head & neck  No large vessel occlusion, hemodynamically significant stenosis, or evidence of dissection.  MRI   Mild-to-moderate intermittent motion degradation.  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  Chronic lacunar infarct within the posterior limb of left internal capsule.  Background moderate chronic small vessel ischemic disease.  Subcentimeter chronic infarct within the right cerebellar hemisphere.  Mild left maxillary paranasal sinus disease, as described.  2D Echo: LVEF 60-65% otherwise neg for structural abnormalities.  LDL 40  HgbA1c 6.2  VTE prophylaxis - SCDs    Diet  Diet Heart Room service appropriate? Yes; Fluid consistency: Thin     No antithrombotic prior to admission, now on aspirin 81 mg daily and clopidogrel 75 mg daily. Take DAPT for 3 weeks then Plavix 75mg  alone.  Therapy recommendations:  PT/OT... Patient passed her swallow study  Disposition:  Pending  Hypertension  Home meds:  Hyzaar 100-25mg  . Permissive hypertension (OK if < 220/120) but gradually normalize in 5-7 days . Long-term BP goal normotensive   Other Stroke Risk Factors  Advanced Age >/= 24   Cigarette smoker: advised to stop smoking. Patient agreed to start nicotine patch.  Start 14mg  Nicotine patch, patient reported smoking about 0.5ppd  Obesity, Body mass index is 30.85 kg/m., BMI >/= 30 associated with increased stroke risk, recommend weight loss, diet and exercise as appropriate   Nutrition consult  Hospital day # 1  Damita Dunnings, MD PGY-1 I have personally obtained history,examined this patient, reviewed notes, independently viewed imaging studies, participated in medical decision making and plan of care.ROS completed by me personally and pertinent positives fully documented  I have made any additions or clarifications directly to the above note. Agree with note above.  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  She received IV tPA and has shown some improvement.  Continue close neurological monitoring and strict blood pressure control as per post tPA protocol.  Recommend dual antiplatelet therapy of aspirin and Plavix for 3 weeks followed by aspirin alone and aggressive risk factor modification.  Check echocardiogram.  Mobilize out of bed.  Therapy consults.  Long discussion with patient and her niece and answered questions.  This patient is critically ill and at significant risk of neurological worsening, death and care requires constant monitoring of vital signs, hemodynamics,respiratory and cardiac monitoring, extensive review of multiple databases, frequent neurological assessment, discussion with family, other specialists and medical decision making of high complexity.I have made any additions or clarifications directly to the above note.This critical care time does not reflect procedure time, or teaching time or supervisory time of PA/NP/Med Resident etc but could involve care discussion time.  I spent 30 minutes of neurocritical care time  in the care of  this patient.      Antony Contras, MD Medical Director [facility] Pager: [phone] 07/20/2020 3:26 PM    To contact Stroke Continuity provider, please refer to [email or web address] After hours, contact General Neurology

## 2020-07-20 NOTE — Progress Notes (Signed)
Responded to consult for Midline placement. Midline was attempted by day shift VAST RN and was unsuccessful. Discussed possibility of a 2nd attempt by night VAST RN. Pt declined midline at this time and peripheral IV obtained. Pt is not receiving any current IV infusions. Discussed with RN and VAST available for consult if any additional needs

## 2020-07-20 NOTE — Evaluation (Signed)
Physical Therapy Evaluation Patient Details Name: Monica Rivers MRN: [id number] DOB: 1945-10-20 Today's Date: 07/20/2020   History of Present Illness  Pt is 75 yo female who presents with blurred vision, slurred speech, and dizziness. Pt received tPA 4/25. 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  PMH: HTN, GERD, CKD, vit D deficiency, tobacco abuse, anxiety, and pre diabetes.  Clinical Impression  Pt admitted with above diagnosis. Pt received with visual deficits causing balance disturbance. Min A needed for safety for all out of bed activity. Pt ambulated within room with HHA and with RW. Recommend OT for patching or taping glasses. PT will follow acutely and rec outpt PT on d/c. Pt's husband able to take her to appts.  Pt currently with functional limitations due to the deficits listed below (see PT Problem List). Pt will benefit from skilled PT to increase their independence and safety with mobility to allow discharge to the venue listed below.       Follow Up Recommendations Outpatient PT;Supervision for mobility/OOB    Equipment Recommendations  Other (comment) (TBD)    Recommendations for Other Services OT consult     Precautions / Restrictions Precautions Precautions: Fall Precaution Comments: visual deficits noted, superior and inferior Restrictions Weight Bearing Restrictions: No      Mobility  Bed Mobility Overal bed mobility: Needs Assistance Bed Mobility: Supine to Sit;Sit to Supine     Supine to sit: Supervision Sit to supine: Supervision   General bed mobility comments: pt able to perform bed mobility without assist but needs increased time due to visual deficits affecting her ability to remove covers as well as her mvmt patterns.    Transfers Overall transfer level: Needs assistance Equipment used: None Transfers: Sit to/from Stand Sit to Stand: Min  assist         General transfer comment: min A for safety from bed and toilet, pt unsteady, needs several seconds before ambulating to gain balance  Ambulation/Gait Ambulation/Gait assistance: Min assist Gait Distance (Feet): 20 Feet Assistive device: 1 person hand held assist;Rolling walker (2 wheeled) Gait Pattern/deviations: Step-through pattern;Decreased stride length Gait velocity: decreased Gait velocity interpretation: <1.8 ft/sec, indicate of risk for recurrent falls General Gait Details: cautious, guarded gait sue to visual deficits. Min A needed either HH or with RW for safety  Stairs            Wheelchair Mobility    Modified Rankin (Stroke Patients Only) Modified Rankin (Stroke Patients Only) Pre-Morbid Rankin Score: No symptoms Modified Rankin: Moderately severe disability     Balance Overall balance assessment: Needs assistance Sitting-balance support: Feet supported;No upper extremity supported Sitting balance-Leahy Scale: Fair     Standing balance support: No upper extremity supported Standing balance-Leahy Scale: Fair Standing balance comment: unable to accept perturbation in standing without support                             Pertinent Vitals/Pain Pain Assessment: No/denies pain    Home Living Family/patient expects to be discharged to:: Private residence Living Arrangements: Spouse/significant other Available Help at Discharge: Family;Available 24 hours/day Type of Home: House Home Access: Stairs to enter Entrance Stairs-Rails: Right Entrance Stairs-Number of Steps: 3 Home Layout: One level Home Equipment: Environmental consultant - 2 wheels Additional Comments: lives with husband, both retired. Husband drives.    Prior Function Level of  Independence: Independent               Hand Dominance        Extremity/Trunk Assessment   Upper Extremity Assessment Upper Extremity Assessment: Defer to OT evaluation    Lower Extremity  Assessment Lower Extremity Assessment: Overall WFL for tasks assessed    Cervical / Trunk Assessment Cervical / Trunk Assessment: Normal  Communication   Communication: No difficulties  Cognition Arousal/Alertness: Awake/alert Behavior During Therapy: WFL for tasks assessed/performed Overall Cognitive Status: Within Functional Limits for tasks assessed                                        General Comments General comments (skin integrity, edema, etc.): VSS. With visual tracking pt's eyes move R and L but not up and down. She is unable to see objects unless they are right in front of her face.    Exercises     Assessment/Plan    PT Assessment Patient needs continued PT services  PT Problem List Decreased balance;Decreased mobility       PT Treatment Interventions DME instruction;Gait training;Stair training;Functional mobility training;Therapeutic activities;Therapeutic exercise;Balance training;Patient/family education    PT Goals (Current goals can be found in the Care Plan section)  Acute Rehab PT Goals Patient Stated Goal: return home, see better PT Goal Formulation: With patient Time For Goal Achievement: 08/03/20 Potential to Achieve Goals: Good    Frequency Min 4X/week   Barriers to discharge        Co-evaluation               AM-PAC PT "6 Clicks" Mobility  Outcome Measure Help needed turning from your back to your side while in a flat bed without using bedrails?: None Help needed moving from lying on your back to sitting on the side of a flat bed without using bedrails?: None Help needed moving to and from a bed to a chair (including a wheelchair)?: A Little Help needed standing up from a chair using your arms (e.g., wheelchair or bedside chair)?: A Little Help needed to walk in hospital room?: A Little Help needed climbing 3-5 steps with a railing? : A Little 6 Click Score: 20    End of Session Equipment Utilized During Treatment:  Gait belt Activity Tolerance: Patient tolerated treatment well Patient left: in bed;with call bell/phone within reach;with bed alarm set;with family/visitor present Nurse Communication: Mobility status PT Visit Diagnosis: Unsteadiness on feet (R26.81)    Time: 1017-5102 PT Time Calculation (min) (ACUTE ONLY): 28 min   Charges:   PT Evaluation $PT Eval Moderate Complexity: 1 Mod PT Treatments $Gait Training: 8-22 mins        Leighton Roach, [place]  Pager [phone] Office [place] 07/20/2020, 5:26 PM

## 2020-07-21 MED ORDER — NICOTINE 14 MG/24HR TD PT24: 14.0000 mg | MEDICATED_PATCH | Freq: Every day | TRANSDERMAL | 0 refills | Status: DC

## 2020-07-21 MED ORDER — BUPROPION HCL ER (XL) 150 MG PO TB24: 150.0000 mg | ORAL_TABLET | Freq: Every day | ORAL | 0 refills | Status: DC

## 2020-07-21 MED ORDER — CLOPIDOGREL BISULFATE 75 MG PO TABS: 75.0000 mg | ORAL_TABLET | Freq: Every day | ORAL | 0 refills | Status: AC

## 2020-07-21 MED ORDER — BUPROPION HCL ER (XL) 150 MG PO TB24
150.0000 mg | ORAL_TABLET | Freq: Every day | ORAL | Status: DC
  Administered 2020-07-21: 150 mg via ORAL
  Filled 2020-07-21: qty 1

## 2020-07-21 MED ORDER — ASPIRIN 81 MG PO TBEC: 81.0000 mg | DELAYED_RELEASE_TABLET | Freq: Every day | ORAL | 0 refills | Status: DC

## 2020-07-21 NOTE — Discharge Summary (Addendum)
Stroke Discharge Summary  Patient ID: Monica Rivers   MRN: [id number]      DOB: 12/15/1945  Date of Admission: 07/19/2020 Date of Discharge: 07/21/2020  Attending Physician:  Stroke, Md, MD, Stroke MD Consultant(s):     None  Patient's PCP:  [name], FNP  DISCHARGE DIAGNOSIS:  Active Problems:   Acute ischemic stroke ([place]) right medial thalamus and upper midbrain secondary to small vessel disease Diplopia and vision difficulties   Allergies as of 07/21/2020       Reactions   Morphine And Related Nausea Only, Other (See Comments)   EXTREME NAUSEA- "I could not raise my head off of the pillow"   Other Nausea Only, Other (See Comments)   NO SPICY FOODS- Extreme REFLUX!!   Codeine Rash   Penicillin G Rash        Medication List     STOP taking these medications    Coricidin D Cold/Flu/Sinus 2-5-325 MG Tabs Generic drug: Chlorphen-Phenyleph-APAP   Vitamin D3 50 MCG (2000 UT) Tabs       TAKE these medications    aspirin 81 MG EC tablet Take 1 tablet (81 mg total) by mouth daily. Swallow whole. Start taking on: July 22, 2020   buPROPion 150 MG 24 hr tablet Commonly known as: WELLBUTRIN XL Take 1 tablet (150 mg total) by mouth daily.   clopidogrel 75 MG tablet Commonly known as: PLAVIX Take 1 tablet (75 mg total) by mouth daily. Will need refills from PCP Start taking on: July 22, 2020   losartan-hydrochlorothiazide 100-25 MG tablet Commonly known as: HYZAAR Take 1 tablet by mouth daily.   nicotine 14 mg/24hr patch Commonly known as: NICODERM CQ - dosed in mg/24 hours Place 1 patch (14 mg total) onto the skin daily. Start taking on: July 22, 2020               Durable Medical Equipment  (From admission, onward)           Start     Ordered   07/21/20 1046  For home use only DME Walker rolling  Once       Question Answer Comment  Walker: With 5 Inch Wheels   Patient needs a walker to treat with the following condition Stroke  ([place])      07/21/20 1046            LABORATORY STUDIES CBC    Component Value Date/Time   WBC 6.8 07/19/2020 1309   RBC 5.96 (H) 07/19/2020 1309   HGB 17.7 (H) 07/19/2020 1314   HCT 52.0 (H) 07/19/2020 1314   PLT 159 07/19/2020 1309   MCV 90.1 07/19/2020 1309   MCH 30.5 07/19/2020 1309   MCHC 33.9 07/19/2020 1309   RDW 13.8 07/19/2020 1309   LYMPHSABS 2.6 07/19/2020 1309   MONOABS 0.8 07/19/2020 1309   EOSABS 0.3 07/19/2020 1309   BASOSABS 0.1 07/19/2020 1309   CMP    Component Value Date/Time   NA 139 07/19/2020 1314   K 4.7 07/19/2020 1314   CL 101 07/19/2020 1314   CO2 27 07/19/2020 1309   GLUCOSE 123 (H) 07/19/2020 1314   BUN 32 (H) 07/19/2020 1314   CREATININE 0.90 07/19/2020 1314   CALCIUM 10.1 07/19/2020 1309   PROT 6.6 07/19/2020 1309   ALBUMIN 4.0 07/19/2020 1309   AST 43 (H) 07/19/2020 1309   ALT 45 (H) 07/19/2020 1309   ALKPHOS 56 07/19/2020 1309   BILITOT 1.1  07/19/2020 1309   GFRNONAA 59 (L) 07/19/2020 1309   COAGS Lab Results  Component Value Date   INR 1.0 07/19/2020   Lipid Panel    Component Value Date/Time   CHOL 95 07/20/2020 0319   TRIG 46 07/20/2020 0319   HDL 46 07/20/2020 0319   CHOLHDL 2.1 07/20/2020 0319   VLDL 9 07/20/2020 0319   LDLCALC 40 07/20/2020 0319   HgbA1C  Lab Results  Component Value Date   HGBA1C 6.2 (H) 07/20/2020   Urinalysis No results found for: COLORURINE, APPEARANCEUR, LABSPEC, PHURINE, GLUCOSEU, HGBUR, BILIRUBINUR, KETONESUR, PROTEINUR, UROBILINOGEN, NITRITE, LEUKOCYTESUR Urine Drug Screen     Component Value Date/Time   LABOPIA NONE DETECTED 07/20/2020 1134   COCAINSCRNUR NONE DETECTED 07/20/2020 1134   LABBENZ NONE DETECTED 07/20/2020 1134   AMPHETMU NONE DETECTED 07/20/2020 1134   THCU NONE DETECTED 07/20/2020 1134   LABBARB NONE DETECTED 07/20/2020 1134    Alcohol Level    Component Value Date/Time   ETH <10 07/19/2020 1309     SIGNIFICANT DIAGNOSTIC STUDIES CT HEAD WO  CONTRAST  Result Date: 07/20/2020 CLINICAL DATA:  Stroke EXAM: CT HEAD WITHOUT CONTRAST TECHNIQUE: Contiguous axial images were obtained from the base of the skull through the vertex without intravenous contrast. COMPARISON:  CT head and MRI head 07/19/2020 FINDINGS: Brain: Small area of acute infarct right medial thalamus on diffusion-weighted imaging not well seen by CT. Patchy white matter hypodensity bilaterally compatible with chronic microvascular ischemia. Negative for acute hemorrhage or mass. Vascular: Negative for hyperdense vessel. Skull: Negative Sinuses/Orbits: Mild mucosal edema paranasal sinuses. Right cataract extraction Other: None IMPRESSION: Small acute infarct right medial thalamus best seen on MRI Chronic microvascular ischemic change in the white matter. No acute hemorrhage Electronically Signed   By: Franchot Gallo M.D.   On: 07/20/2020 18:10   MR BRAIN WO CONTRAST  Result Date: 07/19/2020 CLINICAL DATA:  Neuro deficit, acute, stroke suspected. Additional history provided: Patient reports dizziness, vision changes, slurred speech. EXAM: MRI HEAD WITHOUT CONTRAST TECHNIQUE: Multiplanar, multiecho pulse sequences of the brain and surrounding structures were obtained without intravenous contrast. COMPARISON:  Noncontrast head CT and CT angiogram head/neck performed earlier today 07/19/2020. FINDINGS: Brain: Mild-to-moderate intermittent motion degradation. Cerebral volume is normal for age. 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 Chronic lacunar infarct within the posterior limb of left internal capsule/basal ganglia. Background moderate multifocal T2/FLAIR hyperintensity within the cerebral white matter, nonspecific but compatible with chronic small vessel ischemic disease. Mild chronic small vessel ischemic changes are also present within the pons. Subcentimeter chronic infarct within the right cerebellar hemisphere. No evidence of  intracranial mass. No chronic intracranial blood products. No extra-axial fluid collection. No midline shift. Vascular: Expected proximal arterial flow voids. Skull and upper cervical spine: No focal marrow lesion. Incomplete assessed upper cervical spondylosis. Sinuses/Orbits: Visualized orbits show no acute finding. Prior right lens replacement. Trace mucosal thickening and small mucous retention cyst within the left maxillary sinus. IMPRESSION: Mild-to-moderate intermittent motion degradation. 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 Chronic lacunar infarct within the posterior limb of left internal capsule. Background moderate chronic small vessel ischemic disease. Subcentimeter chronic infarct within the right cerebellar hemisphere. Mild left maxillary paranasal sinus disease, as described. Electronically Signed   By: Kellie Simmering DO   On: 07/19/2020 18:32   ECHOCARDIOGRAM COMPLETE  Result Date: 07/20/2020    ECHOCARDIOGRAM REPORT   Patient Name:  Monica Rivers Date of Exam: 07/20/2020 Medical Rec #:  [id number]      Height:       63.0 in Accession #:    [id number]     Weight:       174.2 lb Date of Birth:  10-19-1945       BSA:          1.823 m Patient Age:    67 years       BP:           132/70 mmHg Patient Gender: F              HR:           75 bpm. Exam Location:  Inpatient Procedure: 2D Echo, Cardiac Doppler and Color Doppler Indications:    CVA  History:        Patient has no prior history of Echocardiogram examinations.  Sonographer:    Luisa Hart RDCS Referring Phys: [id number] Karsten Fells [facility]  Sonographer Comments: Suboptimal subcostal window. IMPRESSIONS  1. Left ventricular ejection fraction, by estimation, is 60 to 65%. The left ventricle has normal function. The left ventricle has no regional wall motion abnormalities. Left ventricular diastolic parameters are consistent with Grade I diastolic dysfunction (impaired relaxation).  2. Right  ventricular systolic function is normal. The right ventricular size is normal. There is normal pulmonary artery systolic pressure.  3. The mitral valve is normal in structure. Trivial mitral valve regurgitation. No evidence of mitral stenosis.  4. The aortic valve is normal in structure. Aortic valve regurgitation is not visualized. No aortic stenosis is present.  5. The inferior vena cava is normal in size with greater than 50% respiratory variability, suggesting right atrial pressure of 3 mmHg. FINDINGS  Left Ventricle: Left ventricular ejection fraction, by estimation, is 60 to 65%. The left ventricle has normal function. The left ventricle has no regional wall motion abnormalities. The left ventricular internal cavity size was normal in size. There is  no left ventricular hypertrophy. Left ventricular diastolic parameters are consistent with Grade I diastolic dysfunction (impaired relaxation). Normal left ventricular filling pressure. Right Ventricle: The right ventricular size is normal. No increase in right ventricular wall thickness. Right ventricular systolic function is normal. There is normal pulmonary artery systolic pressure. The tricuspid regurgitant velocity is 2.64 m/s, and  with an assumed right atrial pressure of 3 mmHg, the estimated right ventricular systolic pressure is 123456 mmHg. Left Atrium: Left atrial size was normal in size. Right Atrium: Right atrial size was normal in size. Pericardium: There is no evidence of pericardial effusion. Mitral Valve: The mitral valve is normal in structure. Trivial mitral valve regurgitation. No evidence of mitral valve stenosis. MV peak gradient, 4.7 mmHg. The mean mitral valve gradient is 2.0 mmHg. Tricuspid Valve: The tricuspid valve is normal in structure. Tricuspid valve regurgitation is mild . No evidence of tricuspid stenosis. Aortic Valve: The aortic valve is normal in structure. Aortic valve regurgitation is not visualized. No aortic stenosis is  present. Aortic valve mean gradient measures 4.0 mmHg. Aortic valve peak gradient measures 8.1 mmHg. Aortic valve area, by VTI measures 2.03 cm. Pulmonic Valve: The pulmonic valve was normal in structure. Pulmonic valve regurgitation is not visualized. No evidence of pulmonic stenosis. Aorta: The aortic root is normal in size and structure. Venous: The inferior vena cava is normal in size with greater than 50% respiratory variability, suggesting right atrial pressure of 3 mmHg. IAS/Shunts: No atrial level shunt detected by  color flow Doppler.  LEFT VENTRICLE PLAX 2D LVIDd:         4.00 cm     Diastology LVIDs:         2.50 cm     LV e' medial:    8.49 cm/s LV PW:         1.00 cm     LV E/e' medial:  10.4 LV IVS:        1.10 cm     LV e' lateral:   9.36 cm/s LVOT diam:     1.90 cm     LV E/e' lateral: 9.5 LV SV:         67 LV SV Index:   37 LVOT Area:     2.84 cm  LV Volumes (MOD) LV vol d, MOD A2C: 26.0 ml LV vol d, MOD A4C: 39.6 ml LV vol s, MOD A2C: 13.6 ml LV vol s, MOD A4C: 14.1 ml LV SV MOD A2C:     12.4 ml LV SV MOD A4C:     39.6 ml LV SV MOD BP:      20.1 ml RIGHT VENTRICLE RV S prime:     15.10 cm/s TAPSE (M-mode): 2.5 cm LEFT ATRIUM             Index       RIGHT ATRIUM           Index LA diam:        3.10 cm 1.70 cm/m  RA Area:     13.30 cm LA Vol (A2C):   47.3 ml 25.94 ml/m RA Volume:   28.90 ml  15.85 ml/m LA Vol (A4C):   47.5 ml 26.05 ml/m LA Biplane Vol: 47.4 ml 26.00 ml/m  AORTIC VALVE                   PULMONIC VALVE AV Area (Vmax):    2.16 cm    PV Vmax:       0.91 m/s AV Area (Vmean):   1.85 cm    PV Vmean:      60.600 cm/s AV Area (VTI):     2.03 cm    PV VTI:        0.215 m AV Vmax:           142.00 cm/s PV Peak grad:  3.3 mmHg AV Vmean:          99.000 cm/s PV Mean grad:  2.0 mmHg AV VTI:            0.333 m AV Peak Grad:      8.1 mmHg AV Mean Grad:      4.0 mmHg LVOT Vmax:         108.00 cm/s LVOT Vmean:        64.700 cm/s LVOT VTI:          0.238 m LVOT/AV VTI ratio: 0.71  AORTA Ao  Root diam: 3.10 cm Ao Asc diam:  2.60 cm MITRAL VALVE                TRICUSPID VALVE MV Area (PHT): 4.31 cm     TR Peak grad:   27.9 mmHg MV Area VTI:   2.13 cm     TR Vmax:        264.00 cm/s MV Peak grad:  4.7 mmHg MV Mean grad:  2.0 mmHg     SHUNTS MV Vmax:       1.08 m/s  Systemic VTI:  0.24 m MV Vmean:      59.8 cm/s    Systemic Diam: 1.90 cm MV Decel Time: 176 msec MR Peak grad: 95.6 mmHg MR Vmax:      489.00 cm/s MV E velocity: 88.60 cm/s MV A velocity: 108.00 cm/s MV E/A ratio:  0.82 Mihai Croitoru MD Electronically signed by Sanda Klein MD Signature Date/Time: 07/20/2020/10:59:08 AM    Final    CT HEAD CODE STROKE WO CONTRAST  Result Date: 07/19/2020 CLINICAL DATA:  Code stroke.  Dizzy, visual changes EXAM: CT HEAD WITHOUT CONTRAST TECHNIQUE: Contiguous axial images were obtained from the base of the skull through the vertex without intravenous contrast. COMPARISON:  None. FINDINGS: Brain: There is no acute intracranial hemorrhage, mass effect, or edema. No acute appearing loss of gray-white differentiation. Patchy and confluent areas of hypoattenuation in the supratentorial white matter are nonspecific but may reflect mild to moderate chronic microvascular ischemic changes. Ventricles and sulci are within normal limits in size and configuration. No extra-axial collection. Vascular: No hyperdense vessel. There is minima intracranial atherosclerotic calcification at the skull base. Skull: Unremarkable. Sinuses/Orbits: No acute abnormality. Other: Mastoid air cells are clear. ASPECTS ([place] Stroke Program Early CT Score) - Ganglionic level infarction (caudate, lentiform nuclei, internal capsule, insula, M1-M3 cortex): 7 - Supraganglionic infarction (M4-M6 cortex): 3 Total score (0-10 with 10 being normal): 10 IMPRESSION: There is no acute intracranial hemorrhage or evidence of acute infarction. ASPECT score is 10. Chronic microvascular ischemic changes. Initial results were communicated to Dr.  Quinn Axe at 1:26 pm on 07/19/2020 by text page via the [facility] messaging system. Electronically Signed   By: Macy Mis M.D.   On: 07/19/2020 13:34   CT ANGIO HEAD CODE STROKE  Result Date: 07/19/2020 CLINICAL DATA:  Dizziness, visual changes EXAM: CT ANGIOGRAPHY HEAD AND NECK TECHNIQUE: Multidetector CT imaging of the head and neck was performed using the standard protocol during bolus administration of intravenous contrast. Multiplanar CT image reconstructions and MIPs were obtained to evaluate the vascular anatomy. Carotid stenosis measurements (when applicable) are obtained utilizing NASCET criteria, using the distal internal carotid diameter as the denominator. CONTRAST:  71mL OMNIPAQUE IOHEXOL 350 MG/ML SOLN COMPARISON:  None. FINDINGS: CTA NECK Aortic arch: Great vessel origins are patent. Right carotid system: Patent. No stenosis or evidence of dissection. Left carotid system: Patent.  No stenosis or evidence of dissection. Vertebral arteries: Patent. Left vertebral artery is slightly dominant no stenosis or evidence of dissection. Skeleton: Degenerative changes of the included spine. Other neck: Unremarkable. Upper chest: Mild emphysema. Review of the MIP images confirms the above findings CTA HEAD Anterior circulation: Intracranial internal carotid arteries are patent. There is minimal calcified plaque. Anterior and middle cerebral arteries are patent. Anterior communicating artery is present. Posterior circulation: Intracranial vertebral arteries are patent. There is extradural origin of the left PICA. Basilar artery is patent. Posterior cerebral arteries are patent. A left posterior communicating artery is present and the dominant supply of the PCA. Venous sinuses: Patent as allowed by contrast bolus timing. Review of the MIP images confirms the above findings IMPRESSION: No large vessel occlusion, hemodynamically significant stenosis, or evidence of dissection. Electronically Signed   By: Macy Mis M.D.   On: 07/19/2020 13:44   CT ANGIO NECK CODE STROKE  Result Date: 07/19/2020 CLINICAL DATA:  Dizziness, visual changes EXAM: CT ANGIOGRAPHY HEAD AND NECK TECHNIQUE: Multidetector CT imaging of the head and neck was performed using the standard protocol during bolus administration of intravenous  contrast. Multiplanar CT image reconstructions and MIPs were obtained to evaluate the vascular anatomy. Carotid stenosis measurements (when applicable) are obtained utilizing NASCET criteria, using the distal internal carotid diameter as the denominator. CONTRAST:  64mL OMNIPAQUE IOHEXOL 350 MG/ML SOLN COMPARISON:  None. FINDINGS: CTA NECK Aortic arch: Great vessel origins are patent. Right carotid system: Patent. No stenosis or evidence of dissection. Left carotid system: Patent.  No stenosis or evidence of dissection. Vertebral arteries: Patent. Left vertebral artery is slightly dominant no stenosis or evidence of dissection. Skeleton: Degenerative changes of the included spine. Other neck: Unremarkable. Upper chest: Mild emphysema. Review of the MIP images confirms the above findings CTA HEAD Anterior circulation: Intracranial internal carotid arteries are patent. There is minimal calcified plaque. Anterior and middle cerebral arteries are patent. Anterior communicating artery is present. Posterior circulation: Intracranial vertebral arteries are patent. There is extradural origin of the left PICA. Basilar artery is patent. Posterior cerebral arteries are patent. A left posterior communicating artery is present and the dominant supply of the PCA. Venous sinuses: Patent as allowed by contrast bolus timing. Review of the MIP images confirms the above findings IMPRESSION: No large vessel occlusion, hemodynamically significant stenosis, or evidence of dissection. Electronically Signed   By: Macy Mis M.D.   On: 07/19/2020 13:44      HISTORY OF PRESENT ILLNESS Ms Gasparini is a 75 yo female with a PMHx of  HTN, GERD, CKD III, Vitamin D deficiency, tobacco abuse, anxiety, and "pre" DM II with last HbA1c 6.1% 3/22. Patient presents by EMS for acute symptoms of blurred vision, slurred speech, and dizziness that started at 0945 hours today and prior to that, she was normal as seen by her husband. Her symptoms resolved at 1200 hours with the exception of vision problems. Patient is on no blood thinners or anti platelets. No personal history or FMHx of stroke.    After brief exam on the ED bridge, patient was taken emergently to the CT suite. NIHSS 1 gaze abnormality; patient had new complete bilateral vertical gaze palsy. She also reported persistent dizziness. CTH without acute finding. CTA head and neck negative for LVO. Given patient's symptoms and the open window for tPA, consent and checklist were reviewed with patient by Dr. Quinn Axe. Patient consented and tPA was started.    On exam in ED room after CTs, patient had 2 complaints. A HA, dull, aggravating but not severe to her forehead and occipital areas. + hx of MHAs associated with menses which did not continue after menopause. The other issue is blurred vision described as not being able to focus which is worse with both eyes open. Much less blurriness when one or the other eye is covered. This is new for her. She has a history of vertigo but has only had one really bad spell with this once. She vomited with that episode. She says that occasionally she will get dizzy, described as the room spinning, if she bends over and stands up quickly. No syncope.    In review of chart, her last OV at [facility] showed a HbA1c of 6.1%. Could not locate a lipid profile, but she was prescribed Atorvastatin but never took it. Note states that she has declined statins.    HOSPITAL COURSE Patient received tPA and was confirmed to have small acute infarct in the medial rt  thalamus likely etiology small vessel disease via MRI Head. Patient stroke work-up was otherwise negative  with no LVO or hemodynamically stenosis or evidence or dissection  on CTA Head and Neck. Patient echo was negative for PFO or other structural abnormalities increasing risk for stroke. PT and OT saw patient. OT gave patient glasses with tape over the L eye to assist with visual disturbances. Patient reported that they helped. Patient also agreed to receive assistance with smoking cessation to decrease her risk foir future smoke. Patient was started on nicotine patches, but patient reported continued anxiety and was concerned that without her cigarettes her anxiety would get worse. Patient started on 150mg  daily of Wellbutrin. Patient confirmed no hx of epilepsy.   RN Pressure Injury Documentation:     DISCHARGE EXAM Blood pressure 132/84, pulse 77, temperature 97.8 F (36.6 C), temperature source Oral, resp. rate 18, height 5\' 3"  (1.6 m), weight 79 kg, SpO2 99 %. Constitutional: Appears well-developed and well-nourished.  Psych: Affect appropriate to situation Eyes: No scleral injection. She is moving her eyes around a lot (trying to correct her vision) HENT: No OP obstrucion Head: Normocephalic.  Cardiovascular: Normal rate and regular rhythm.  Respiratory: Effort normal. Lungs CTA.  Skin: WDI   Neuro: Mental Status: Patient is awake, alert, oriented to person, place, month, year, and situation. Patient is able to give a clear and coherent history. No signs of aphasia or neglect Speech/Language: Speech is fluent, repetition and naming intact. Comprehension intact. No dysarthria or aphasia.  Cranial Nerves: II: Visual Fields are full. Pupils are 43mm equal, round, and reactive to light.  III,IV, VI: bilateral vertical gaze palsy,horizontal movements improved. Dysconjugate gaze noted but improving. Binocular vertical diplopia noted with up and down gaze mainly right more restricted than left. V: Facial sensation is symmetric to light touch.  VII: Facial movement is symmetric.  VIII: hearing  is intact to voice X: Uvula elevates symmetrically XI: Shoulder shrug is symmetric. XII: tongue is midline without atrophy or fasciculations.  Motor: Tone is normal. Bulk is normal. 5/5 strength was present in all four extremities.  Sensory: Sensation is symmetric to light touch in the arms and legs.  Plantars: Toes are downgoing bilaterally.  Cerebellar: FNF and HKS are intact bilaterally.  Discharge Diet       Diet   Diet Heart Room service appropriate? Yes; Fluid consistency: Thin   liquids  DISCHARGE PLAN Disposition:  Home w/ OP PT and OP OT aspirin 81 mg daily and clopidogrel 75 mg daily for secondary stroke prevention for 3 weeks then Plavix alone. Ongoing stroke risk factor control by Primary Care Physician at time of discharge Follow-up PCP [name], FNP in 2 weeks. Follow-up in [facility] in 4 weeks, office to schedule an appointment.   32 minutes were spent preparing discharge.  Damita Dunnings, MD PGY-1 I have personally obtained history,examined this patient, reviewed notes, independently viewed imaging studies, participated in medical decision making and plan of care.ROS completed by me personally and pertinent positives fully documented  I have made any additions or clarifications directly to the above note. Agree with note above.   Antony Contras, MD Medical Director [facility] Pager: [phone] 07/21/2020 4:15 PM

## 2020-07-21 NOTE — Progress Notes (Signed)
Patient ready for discharge. IV and telemetry removed, discharge paperwork reviewed and confirmed understanding via teachback. Patient's home medications were retrieved from pharmacy and returned to her. Patient was transported off the unit via wheelchair and is being transported home by her husband in their vehicle.

## 2020-07-21 NOTE — Progress Notes (Signed)
Nutrition Education Note  RD consulted for nutrition education regarding future stroke prevention. Noticed pt with pending discharge, so placed "Stroke Nutrition Therapy" handout from the [employer] in pt's discharge summary to printout at discharge in case RD was unable to provide education in time. RD went to pt's room ~5 minutes later to provide education and pt was not in room; pt had discharged.   Handout given to pt at discharge provides the following information:  Tips to control blood pressure and blood cholesterol levels  Foods that are and are not recommended for regular consumption, categorized by food group   Stroke Sample 1-Day Menu  Stroke Vegan Sample 1-Day Menu  Stroke Vegetarian (Lacto-Ovo) Sample 1-Day Menu  Please note handout also discusses what to do if pt has dysphagia.   Larkin Ina, MS, RD, LDN RD pager number and weekend/on-call pager number located in [place].

## 2020-07-21 NOTE — Progress Notes (Signed)
Physical Therapy Treatment Patient Details Name: Monica Rivers MRN: [id number] DOB: Aug 16, 1945 Today's Date: 07/21/2020    History of Present Illness Pt is 75 y.o. female admitted 07/19/20 with blurred vision, slurred speech, and dizziness; pt received tPA same day. 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  PMH includes HTN, GERD, CKD, vit D deficiency, tobacco abuse, anxiety, pre diabetes.   PT Comments    Pt progressing with mobility. Pt reports slight improvement in visual deficits, but still reports difficulty with visual acuity and depth perception; pt endorses instability with mobility, especially with head turns (primarily up/down). Pt prefers use of RW for stability with ambulation; pt tolerated gait training with RW and stair training with supervision for safety. Trialled ambulation without DME, pt needing min guard for balance. Pt hopeful for return home soon. Continue to recommend follow-up with OP neuro PT; pt in agreement.   Follow Up Recommendations  Outpatient PT;Supervision for mobility/OOB     Equipment Recommendations  Rolling walker with 5" wheels    Recommendations for Other Services       Precautions / Restrictions Precautions Precautions: Fall;Other (comment) Precaution Comments: Visual deficits Restrictions Weight Bearing Restrictions: No    Mobility  Bed Mobility Overal bed mobility: Modified Independent Bed Mobility: Supine to Sit                Transfers Overall transfer level: Needs assistance Equipment used: Rolling walker (2 wheeled);None Transfers: Sit to/from Stand Sit to Stand: Supervision;Min guard         General transfer comment: Pt requesting use of RW for mobility; able to stand from EOB with supervision for safety, cues for hand placement; additional trial without DME, min guard for balance  Ambulation/Gait Ambulation/Gait  assistance: Supervision;Min guard Gait Distance (Feet): 180 Feet (+38) Assistive device: Rolling walker (2 wheeled);None Gait Pattern/deviations: Step-through pattern;Decreased stride length Gait velocity: Decreased   General Gait Details: Pt requesting use of RW for mobility. Slow, guarded gait with RW and supervision for safety, pt reports visual deficits ("difficult to focus") especially when looking up/down, no overt instability or LOB; additional gait trial without DME, min guard for balance   Stairs Stairs: Yes Stairs assistance: Supervision Stair Management: One rail Right;Step to pattern;Forwards Number of Stairs: 2 General stair comments: Ascend/descended 2 steps with single UE support on rail, supervision for safety   Wheelchair Mobility    Modified Rankin (Stroke Patients Only) Modified Rankin (Stroke Patients Only) Pre-Morbid Rankin Score: No symptoms Modified Rankin: Moderate disability     Balance Overall balance assessment: Needs assistance Sitting-balance support: Feet supported;No upper extremity supported Sitting balance-Leahy Scale: Good     Standing balance support: No upper extremity supported Standing balance-Leahy Scale: Fair                              Cognition Arousal/Alertness: Awake/alert Behavior During Therapy: WFL for tasks assessed/performed Overall Cognitive Status: Within Functional Limits for tasks assessed                                        Exercises      General Comments        Pertinent Vitals/Pain Pain Assessment: No/denies pain    Home Living  Prior Function            PT Goals (current goals can now be found in the care plan section) Progress towards PT goals: Progressing toward goals    Frequency    Min 4X/week      PT Plan Current plan remains appropriate    Co-evaluation              AM-PAC PT "6 Clicks" Mobility   Outcome  Measure  Help needed turning from your back to your side while in a flat bed without using bedrails?: None Help needed moving from lying on your back to sitting on the side of a flat bed without using bedrails?: None Help needed moving to and from a bed to a chair (including a wheelchair)?: A Little Help needed standing up from a chair using your arms (e.g., wheelchair or bedside chair)?: A Little Help needed to walk in hospital room?: A Little Help needed climbing 3-5 steps with a railing? : A Little 6 Click Score: 20    End of Session Equipment Utilized During Treatment: Gait belt Activity Tolerance: Patient tolerated treatment well Patient left: in bed;with call bell/phone within reach;with bed alarm set Nurse Communication: Mobility status PT Visit Diagnosis: Unsteadiness on feet (R26.81)     Time: 2536-6440 PT Time Calculation (min) (ACUTE ONLY): 18 min  Charges:  $Gait Training: 8-22 mins                    Mabeline Caras, PT, DPT Acute Rehabilitation Services  Pager [phone] Office [place] 07/21/2020, 9:48 AM

## 2020-07-21 NOTE — Progress Notes (Signed)
Occupational Therapy Treatment Patient Details Name: Monica Rivers MRN: [id number] DOB: 01-21-1946 Today's Date: 07/21/2020    History of present illness Pt is 75 y.o. female admitted 07/19/20 with blurred vision, slurred speech, and dizziness; pt received tPA same day. 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  PMH includes HTN, GERD, CKD, vit D deficiency, tobacco abuse, anxiety, pre diabetes.   OT comments  Returned to see patient and assess how partial occlusion glasses are working.  She reports she "thinks they are helping because she can see clearer". Encouraged use of glasses for distance and occluded reading glasses while reading. Educated on use of taping and progressive removal of tape.  Pt anxious about dc and having another stroke, reviewed signs/symptoms with [facility].  Completing transfers and toileting, in room mobility with SPV.  Will follow acutely.    Follow Up Recommendations  Outpatient OT;Supervision - Intermittent    Equipment Recommendations  None recommended by OT    Recommendations for Other Services      Precautions / Restrictions Precautions Precautions: Fall;Other (comment) Precaution Comments: Visual deficits Restrictions Weight Bearing Restrictions: No       Mobility Bed Mobility Overal bed mobility: Modified Independent                  Transfers Overall transfer level: Needs assistance Equipment used: Rolling walker (2 wheeled);None Transfers: Sit to/from Stand Sit to Stand: Supervision         General transfer comment: for safety/balance, preference to use RW    Balance Overall balance assessment: Needs assistance Sitting-balance support: Feet supported;No upper extremity supported Sitting balance-Leahy Scale: Good     Standing balance support: Bilateral upper extremity supported;During functional activity;No upper extremity  supported Standing balance-Leahy Scale: Fair                             ADL either performed or assessed with clinical judgement   ADL Overall ADL's : Needs assistance/impaired     Grooming: Supervision/safety;Standing;Wash/dry Geophysical data processor Transfer: Supervision/safety;Ambulation;Regular Toilet;RW   Toileting- Clothing Manipulation and Hygiene: Modified independent;Sit to/from stand       Functional mobility during ADLs: Supervision/safety;Rolling walker       Vision   Additional Comments: reviewed use of partial occlusion glasses, pt reports glasses "seem to be working" and reports "vision is clearer with glasses on for distance and reading".  Educated on use of tape and progression of reduction of tape.   Perception     Praxis      Cognition Arousal/Alertness: Awake/alert Behavior During Therapy: WFL for tasks assessed/performed Overall Cognitive Status: Within Functional Limits for tasks assessed                                          Exercises     Shoulder Instructions       General Comments pt reports anxious about having another stroke, reviewed [facility] FAST    Pertinent Vitals/ Pain       Pain Assessment: No/denies pain  Home Living Family/patient expects to be discharged to:: Private residence Living Arrangements: Spouse/significant other  Prior Functioning/Environment              Frequency  Min 2X/week        Progress Toward Goals  OT Goals(current goals can now be found in the care plan section)  Progress towards OT goals: Progressing toward goals  Acute Rehab OT Goals Patient Stated Goal: return home, see better OT Goal Formulation: With patient  Plan Discharge plan remains appropriate;Frequency remains appropriate    Co-evaluation                 AM-PAC OT "6 Clicks" Daily Activity     Outcome Measure   Help from  another person eating meals?: None Help from another person taking care of personal grooming?: A Little Help from another person toileting, which includes using toliet, bedpan, or urinal?: A Little Help from another person bathing (including washing, rinsing, drying)?: A Little Help from another person to put on and taking off regular upper body clothing?: A Little Help from another person to put on and taking off regular lower body clothing?: A Little 6 Click Score: 19    End of Session Equipment Utilized During Treatment: Rolling walker  OT Visit Diagnosis: Other abnormalities of gait and mobility (R26.89);Low vision, both eyes (H54.2);Other symptoms and signs involving the nervous system (R29.898)   Activity Tolerance Patient tolerated treatment well   Patient Left in bed;with call bell/phone within reach;with bed alarm set   Nurse Communication Mobility status        Time: 0938-1829 OT Time Calculation (min): 31 min  Charges: OT General Charges $OT Visit: 1 Visit OT Treatments $Therapeutic Activity: 8-22 mins $Neuromuscular Re-education: 8-22 mins  Jolaine Artist, OT [place] Pager [phone] Office [phone]    Delight Stare 07/21/2020, 3:03 PM

## 2020-07-21 NOTE — TOC Initial Note (Signed)
Transition of Care [facility]) - Initial/Assessment Note    Patient Details  Name: Monica Rivers MRN: [id number] Date of Birth: 10/21/1945  Transition of Care [facility]) CM/SW Contact:    Pollie Friar, RN Phone Number: 07/21/2020, 10:51 AM  Clinical Narrative:                 Patient lives at home with her spouse that is able to provide 24 hour supervision. She has DME at home from his prior surgeries except a walker of her own. [employer] to deliver walker to the room.  Pt asking to have outpatient therapy in [facility]. [facility]'t have the OT needed. CM has sent referral to [place]. They will contact her for the first visit.  Pt has needed transportation.  No issues with home medications. TOC following.  Expected Discharge Plan: OP Rehab Barriers to Discharge: Continued Medical Work up   Patient Goals and CMS Choice     Choice offered to / list presented to : Patient  Expected Discharge Plan and Services Expected Discharge Plan: OP Rehab   Discharge Planning Services: CM Consult Post Acute Care Choice: Durable Medical Equipment Living arrangements for the past 2 months: Single Family Home                 DME Arranged: Walker rolling DME Agency: [employer] Date DME Agency Contacted: 07/21/20   Representative spoke with at DME Agency: Peggye [employer]            Prior Living Arrangements/Services Living arrangements for the past 2 months: [place] Lives with:: Spouse Patient language and need for interpreter reviewed:: Yes Do you feel safe going back to the place where you live?: Yes      Need for Family Participation in Patient Care: Yes (Comment) Care giver support system in place?: Yes (comment) Current home services: DME (3 in 1, shower seat) Criminal Activity/Legal Involvement Pertinent to Current Situation/Hospitalization: No - Comment as needed  Activities of Daily Living      Permission Sought/Granted                  Emotional  Assessment Appearance:: Appears stated age Attitude/Demeanor/Rapport: Engaged Affect (typically observed): Accepting Orientation: : Oriented to Self,Oriented to Place,Oriented to  Time,Oriented to Situation   Psych Involvement: No (comment)  Admission diagnosis:  Acute ischemic stroke [facility]) [I63.9] Cerebrovascular accident (CVA), unspecified mechanism ([place]) [I63.9] Patient Active Problem List   Diagnosis Date Noted  . Acute ischemic stroke ([place]) 07/19/2020   PCP:  [name], FNP Pharmacy:   [id number] - [place], [place] - [address] [place] [address] Phone: [phone] Fax: [phone]     Social Determinants of Health (SDOH) Interventions    Readmission Risk Interventions No flowsheet data found.

## 2020-07-21 NOTE — Discharge Instructions (Signed)
Stroke Nutrition Therapy Lowering/controlling your blood pressure helps to lower your risk for stroke. This eating plan is low in sodium (which comes mostly from salt). You should have plenty of vegetables, fruits, whole grains, and fat-free or low-fat dairy products. These foods contain nutrients that can help keep blood pressure under control. You should eat heart-healthy kinds of fat to reduce the buildup of plaque in your blood vessels. If you need to lose weight, following the plan can help you because it limits high-fat foods and refined carbohydrates. Everyone who has had a stroke should talk to their doctor about what a healthy weight is for them.  If You Have Difficulty Swallowing After a stroke, some patients have difficulty swallowing. If you do, check with your doctor to see if you need a special eating plan that changes the texture of foods. Following this plan will prevent food from getting in your windpipe.  Tips to Control Blood Pressure Limit the sodium that you get from food and drink. Your doctor or registered dietitian can tell you the limit that is right for you. In general, foods with more than 300 milligrams (mg) sodium per serving may not fit into your meal plan. Do not salt food at the table. Use very little salt, if any, when you cook. Choose carefully when you eat away from home. Restaurant foods can be very high in sodium. Let the person taking your order know that you want low-salt or no-salt choices. Many restaurants have special menus or will prepare food with less salt. Eat plenty of fruits and vegetables that are high in potassium. Good fruit choices include bananas, apricots, oranges, cantaloupe, and apples. High-potassium vegetables include potatoes, sweet potatoes, spinach, zucchini, and tomatoes.  Have fat-free and low-fat dairy products. These will help you get the calcium and potassium that your body needs. If you drink alcohol, limit the amount. Women  should drink no more than one drink per day. Men should not drink more than two drinks per day. One drink is 12 ounces (oz) of beer, 5 oz of wine, or 1 oz of liquor.  Tips to Control Blood Cholesterol Levels Eat very little saturated fat and trans fat. These types of fat can raise the low-density lipoprotein, or LDL ("bad"), cholesterol in your blood. Saturated fat is found in foods from animals, such as fatty meats, whole milk, butter, cream, and other dairy foods made with whole milk. It is also in tropical oils (palm, palm kernel, and coconut). Trans fat is found in all foods made with hydrogenated oils. It may be in fried foods, crackers, chips, and foods made with shortening or stick margarine. Choose unsaturated fats (heart-healthy fats), such as soybean, canola, olive, or sunflower oil. Liquid or soft tub margarines are also fine. Keep total amount of fat that you eat to less than 25% to 35% of the calories that you get from food and drink. Limit the cholesterol that you get from food to 200 mg of cholesterol per day. Foods high in cholesterol include egg yolks, fatty meats, shrimp, and dairy foods. Get 20 to 30 grams (g) of fiber per day: High-fiber foods include fruits, vegetables, and whole grains. Aim for 2 cups of fruit, 3 cups of vegetables, and 3 oz of whole grains per day. Soluble fiber is especially good for you. You can get it from oatmeal, dried beans, and peas. As you add fiber to your eating plan, you should also drink more water or other fluids. This will help your   body process the fiber without discomfort. Eat cold-water, fatty fish (such as salmon, tuna, mackerel, and sardines) twice a week. These fish provide omega-3 fats, which are heart-healthy. Be aware, however, that canned fish can be high in sodium. Choose fresh or frozen fish, or buy low-sodium canned types. Add ground flaxseed or flaxseed oil to food, or eat walnuts. These plant foods are also high in omega-3  fats.  Foods Recommended Remember: Most foods should have less than 300 mg sodium per serving and have little or no saturated fat or trans fat.  Food Group Foods Recommended Grains  Breads and cereals, especially those made with whole grains such as oats, barley, rye, or whole wheat Pasta, especially whole grain pastas Brown rice Low-fat, low-sodium crackers and pretzels Vegetables  Fresh, frozen, or canned vegetables without added fat or salt Highly colored vegetables, such as broccoli, greens, sweet po tatoes, and tomatoes are especially good for you Fruits Fresh, frozen, canned, or dried fruit Milk and Milk Products Fat-free (skim), low-fat (1%) milk Buttermilk Nonfat or l ow-fat yogurt Nonfat, low-sodium cott age cheese Fat-free and low-fat, low-sodium cheese Meat and Other Protein Foods Fish (especially fatty fish, such as salmon, fresh tuna, or mackerel) Lean cuts of beef and pork (loin, leg, round, extra lean hamburger) Low-sodium cold cuts made with lean meat or soy protein Skinless poultry Venison and other wild game Unsalted nuts and nut butters Dried beans and peas Meat alternatives made with soy or textured vegetable protein Egg whites or egg substitute Fats and Oils Unsaturated oils (soybean, olive, canola, sunflower, safflower) Soft or liquid margarines and vegetable oil spreads Salad dressings (nonfat or made with unsaturated oil) Seeds Avocado Other Herbs and spices to add flavor to replace salt Unsalted, low-fat snack foods, such as unsalted pretzels or plain popcorn  Foods Not Recommended Remember: Most foods should have less than 300 mg sodium per serving and have little or no saturated fat or trans fat.  Food Group Foods Not Recommended Grains  Baked goods made with hydrogenated oil or saturated fat Grain foods that are high in sodium or added sugar Vegetables  Canned vegetables (unless they are low sodium or salt free) Pickles, other vegetables  packed in brine, such as sauerkraut Fried or breaded vegetables Vegetables in cream or butter sauces Fruits  Fried fruits; fruit dishes with cream or butter Milk and Milk Products  Cheese (except for low-fat, low-sodium types) Processed cheese products Whole milk Dairy foods made from whole milk or cream (such as ice cream and half-and-half) Meat and Other Protein Foods  Canned or smoked meat or fish Marbled or fatty meats (such as bacon, sausage, hot dogs, regular hamburger) Whole eggs and egg yolks Poultry with skin High-sodium lunch or deli meats (such as salami) Canned beans (except for low-sodium or salt-free) Fats and Oils  Solid cooking fats (shortening, butter, stick margarine) Tropical oils (palm, palm kernel, or coconut oil) Hydrogenated oil (found in many packaged and fried foods) Other  Salt, seasoning mixes made with salt Soy sauce, miso Canned or dried soups (except for low-fat, low-sodium types) Bouillon cubes Ketchup, barbe cue sauce, worscestershire sauce, salsa Sugary drinks (such as soft drinks or fruit drinks) Snack foods made with hydrogenated oil, shorten ing, or butter High-sodium snack foods (chips, pretzels, salted nuts) High-fat, high-sugar desserts High-fat gravies and sauces Premade foods (boxed pasta mixes, frozen dinners, and so on) if high in sodium or fat Alcohol  Women: Do not have more than 1 drink per day. Men:   Do not have more than 2 drinks per day. 1 drink = 5 oz wine, 12 oz beer, or 1 oz liquor  Stroke Sample 1-Day Menu Breakfast 1/2 cup orange juice 1 cup nonfat milk 3/4 cup oatmeal 1/2 cup blueberries Lunch 2 slices whole-wheat bread 3 oz turkey breast, low sodium 2 slices tomato 1 lettuce leaf 2 teaspoon low-fat mayonnaise 1 teaspoon mustard 1 cup summer squash 1/2 cup unsweetened applesauce Afternoon Snack 1/2 cup canned apricots (in juice, not syrup) 1/2 cup low-fat, low-sodium cottage cheese Evening Meal Tuna noodle  casserole: 3 oz tuna, 1 cup noodles, 1/8 cup nonfat milk, 1 teaspoon margarine 1/2 cup steamed spinach 1/2 cup cooked carrots 1 whole-wheat dinner roll 1 cup nonfat milk 1 teaspoon margarine  Stroke Vegan Sample 1-Day Menu Breakfast 1 cup oatmeal 1 cup blueberries  cup orange juice 1 cup soymilk fortified with calcium, vitamin B12, and vitamin D Morning Snack 1 apple Lunch 2 slices whole wheat bread 2 slices meatless luncheon meat 1 lettuce leaf 2 slices tomato 2 teaspoons low-fat mayonnaise 1 teaspoon mustard 1 cup summer squash, raw  cup unsweetened applesauce Afternoon Snack 1 cup green pepper slices  cup hummus Evening Meal Casserole made with:  cup meatless chicken 1 cup pasta 2 tablespoons soymilk fortified with calcium, vitamin B12, and vitamin D 1 teaspoon margarine, soft, tub  cup cooked carrots  cup steamed spinach 1 whole wheat dinner roll 1 cup soymilk fortified with calcium, vitamin B12, and vitamin D  Stroke Vegetarian (Lacto-Ovo) Sample 1-Day Menu Breakfast 1 cup oatmeal  cup blueberries  cup orange juice 1 cup 1% milk Lunch 2 slices whole wheat bread 2 slices meatless luncheon meat 1 lettuce leaf 2 slices tomato 2 teaspoons low-fat mayonnaise 1 teaspoon mustard 1 cup summer squash, raw  cup unsweetened applesauce Afternoon Snack  cup canned apricots in juice  cup low-fat, low-sodium cottage cheese Evening Meal Casserole made with:  cup meatless chicken 1 cup pasta 2 tablespoons 1% milk 1 teaspoon margarine, soft, tub  cup cooked carrots  cup steamed spinach 1 whole wheat dinner roll 1 cup 1% milk  Copyright Academy of Nutrition and Dietetics. This handout may be duplicated for client education.   

## 2020-07-21 NOTE — Evaluation (Signed)
Occupational Therapy Evaluation Patient Details Name: Monica Rivers MRN: [id number] DOB: [birth year] Today's Date: 07/21/2020    History of Present Illness Pt is 75 y.o. female admitted 07/19/20 with blurred vision, slurred speech, and dizziness; pt received tPA same day. 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  PMH includes HTN, GERD, CKD, vit D deficiency, tobacco abuse, anxiety, pre diabetes.   Clinical Impression   PTA patient independent.  Admitted for above and presenting with impaired balance, impaired vision.  She currently requires supervision for ADLs and functional mobility using RW. Visually, patient has preference to close L eye and voices visual changes as "initial vertical diplopia but now as "different"" describes as distorted images, difficulty reading in lower quadrants and painful with L upper gaze. Therapist provided partial occulsion to L nasal portion of glasses (and reading glasses), and patient reports improved vision acuity and scanning downward/upward to L.  WIth glasses off in hallway, patient reports distorted images with R head turn, but improved with glasses on. Educated pt to wear glasses for distance reading and switch to partial occuled reading glasses with reading. At this time recommend OP OT services at dc, and follow up with opthalmology for full visual assessment. Will follow acutely.     Follow Up Recommendations  Outpatient OT;Supervision - Intermittent    Equipment Recommendations  None recommended by OT    Recommendations for Other Services       Precautions / Restrictions Precautions Precautions: Fall;Other (comment) Precaution Comments: Visual deficits Restrictions Weight Bearing Restrictions: No      Mobility Bed Mobility Overal bed mobility: Modified Independent Bed Mobility: Supine to Sit                Transfers Overall transfer  level: Needs assistance Equipment used: Rolling walker (2 wheeled);None Transfers: Sit to/from Stand Sit to Stand: Supervision         General transfer comment: for safety/balance, preference to use RW    Balance Overall balance assessment: Needs assistance Sitting-balance support: Feet supported;No upper extremity supported Sitting balance-Leahy Scale: Good     Standing balance support: Bilateral upper extremity supported;No upper extremity supported;During functional activity Standing balance-Leahy Scale: Fair                             ADL either performed or assessed with clinical judgement   ADL Overall ADL's : Needs assistance/impaired     Grooming: Supervision/safety;Standing           Upper Body Dressing : Supervision/safety;Sitting   Lower Body Dressing: Supervision/safety;Sit to/from stand   Toilet Transfer: Supervision/safety;Ambulation;RW           Functional mobility during ADLs: Supervision/safety;Rolling walker General ADL Comments: for basic ADLs and transfers     Vision Baseline Vision/History: Wears glasses;Cataracts Wears Glasses: Reading only Patient Visual Report: Diplopia;Eye fatigue/eye pain/headache;Nausea/blurring vision with head movement;Other (comment) (cataract removed from R eye, but not L yet.  Patient reports disjointed images with both eyes, difficulty seeing in lower quadrants and pain with L upper movement with both eyes open.) Vision Assessment?: Yes Eye Alignment: Within Functional Limits Ocular Range of Motion: Within Functional Limits (restricted looking up on the L with both eyes, NOT when eyes testing individually) Alignment/Gaze Preference: Within Defined Limits Tracking/Visual Pursuits: Able to track stimulus in all quads without difficulty Saccades: Other (comment) (limited, saccades causes dizziness) Visual  Fields: No apparent deficits Diplopia Assessment: Other (comment) (present initally but denies  currently) Additional Comments: pt reports vision as "different" describes as distorted images, difficulty reading in lower quadrants and painful with L upper gaze.  When eyes tested individually, improved function but L eye acutiy decreased.  Therapist provided partial occulsion to L nasal portion of glasses (and reading glasses), and patient reports improved vision acuity and scanning downward/upward to L.  WIth glasses off in hallway, patient reports disorted images with R head turn, but improved with glasses on. Educated pt to wear glasses for distance reading and switch to partial occuled reading glasses with reading. WIll conitnue to asses.     Perception     Praxis      Pertinent Vitals/Pain Pain Assessment: No/denies pain     Hand Dominance Right   Extremity/Trunk Assessment Upper Extremity Assessment Upper Extremity Assessment: Overall WFL for tasks assessed   Lower Extremity Assessment Lower Extremity Assessment: Defer to PT evaluation   Cervical / Trunk Assessment Cervical / Trunk Assessment: Normal   Communication Communication Communication: No difficulties   Cognition Arousal/Alertness: Awake/alert Behavior During Therapy: WFL for tasks assessed/performed Overall Cognitive Status: Within Functional Limits for tasks assessed                                     General Comments  VSS    Exercises     Shoulder Instructions      Home Living Family/patient expects to be discharged to:: Private residence Living Arrangements: Spouse/significant other Available Help at Discharge: Family;Available 24 hours/day Type of Home: House Home Access: Stairs to enter [employer] of Steps: 3 Entrance Stairs-Rails: Right Home Layout: One level               Home Equipment: Environmental consultant - 2 wheels   Additional Comments: lives with husband, both retired. Husband drives.      Prior Functioning/Environment Level of Independence: Independent                  OT Problem List: Impaired balance (sitting and/or standing);Impaired vision/perception;Decreased safety awareness;Decreased knowledge of use of DME or AE;Decreased knowledge of precautions      OT Treatment/Interventions: Self-care/ADL training;Neuromuscular education;DME and/or AE instruction;Therapeutic activities;Visual/perceptual remediation/compensation;Patient/family education;Balance training    OT Goals(Current goals can be found in the care plan section) Acute Rehab OT Goals Patient Stated Goal: return home, see better OT Goal Formulation: With patient Time For Goal Achievement: 08/04/20 Potential to Achieve Goals: Good  OT Frequency: Min 2X/week   Barriers to D/C:            Co-evaluation              AM-PAC OT "6 Clicks" Daily Activity     Outcome Measure Help from another person eating meals?: None Help from another person taking care of personal grooming?: A Little Help from another person toileting, which includes using toliet, bedpan, or urinal?: A Little Help from another person bathing (including washing, rinsing, drying)?: A Little Help from another person to put on and taking off regular upper body clothing?: A Little Help from another person to put on and taking off regular lower body clothing?: A Little 6 Click Score: 19   End of Session Equipment Utilized During Treatment: Rolling walker Nurse Communication: Mobility status  Activity Tolerance: Patient tolerated treatment well Patient left: in bed;with call bell/phone within reach;with bed alarm set  OT Visit Diagnosis: Other abnormalities of gait and mobility (R26.89);Low vision, both eyes (H54.2);Other symptoms and signs involving the nervous system (R29.898)                Time: 1583-0940 OT Time Calculation (min): 22 min Charges:  OT General Charges $OT Visit: 1 Visit OT Evaluation $OT Eval Moderate Complexity: 1 Mod  Jolaine Artist, OT Acute Rehabilitation Services Pager  [phone] Office [phone]   Delight Stare 07/21/2020, 10:40 AM

## 2020-07-21 NOTE — TOC Transition Note (Signed)
Transition of Care [facility]) - CM/SW Discharge Note   Patient Details  Name: Monica Rivers MRN: [id number] Date of Birth: 08-04-1945  Transition of Care [facility]) CM/SW Contact:  Pollie Friar, RN Phone Number: 07/21/2020, 12:15 PM   Clinical Narrative:    Patient discharging home with outpatient therapy. Information on the AVS.  Pt has transport home.  Final next level of care: OP Rehab Barriers to Discharge: No Barriers Identified   Patient Goals and CMS Choice     Choice offered to / list presented to : Patient  Discharge Placement                       Discharge Plan and Services   Discharge Planning Services: CM Consult Post Acute Care Choice: Durable Medical Equipment          DME Arranged: Walker rolling DME Agency: [employer] Date DME Agency Contacted: 07/21/20   Representative spoke with at DME Agency: [employer]            Social Determinants of Health ([place]) Interventions     Readmission Risk Interventions No flowsheet data found.

## 2020-07-28 ENCOUNTER — Emergency Department (HOSPITAL_COMMUNITY)
Admission: EM | Admit: 2020-07-28 | Discharge: 2020-07-28 | Disposition: A | Discharge status: Home / Self Care | Payer: Medicare Other | Attending: Emergency Medicine | Admitting: Emergency Medicine

## 2020-07-28 ENCOUNTER — Emergency Department (HOSPITAL_COMMUNITY): Payer: Medicare Other

## 2020-07-28 ENCOUNTER — Other Ambulatory Visit: Payer: Self-pay

## 2020-07-28 DIAGNOSIS — R42 Dizziness and giddiness: Secondary | ICD-10-CM | POA: Diagnosis not present

## 2020-07-28 DIAGNOSIS — Z7982 Long term (current) use of aspirin: Secondary | ICD-10-CM | POA: Insufficient documentation

## 2020-07-28 DIAGNOSIS — Z79899 Other long term (current) drug therapy: Secondary | ICD-10-CM | POA: Insufficient documentation

## 2020-07-28 DIAGNOSIS — R2689 Other abnormalities of gait and mobility: Secondary | ICD-10-CM | POA: Diagnosis not present

## 2020-07-28 DIAGNOSIS — H938X9 Other specified disorders of ear, unspecified ear: Secondary | ICD-10-CM | POA: Diagnosis not present

## 2020-07-28 DIAGNOSIS — J302 Other seasonal allergic rhinitis: Secondary | ICD-10-CM | POA: Insufficient documentation

## 2020-07-28 LAB — BASIC METABOLIC PANEL
Anion gap: 9 (ref 5–15)
BUN: 31 mg/dL — ABNORMAL HIGH (ref 8–23)
CO2: 27 mmol/L (ref 22–32)
Calcium: 10.5 mg/dL — ABNORMAL HIGH (ref 8.9–10.3)
Chloride: 101 mmol/L (ref 98–111)
Creatinine, Ser: 1.21 mg/dL — ABNORMAL HIGH (ref 0.44–1.00)
GFR, Estimated: 47 mL/min — ABNORMAL LOW (ref >60)
Glucose, Bld: 136 mg/dL — ABNORMAL HIGH (ref 70–99)
Potassium: 3.9 mmol/L (ref 3.5–5.1)
Sodium: 137 mmol/L (ref 135–145)

## 2020-07-28 LAB — CBC
HCT: 53.3 % — ABNORMAL HIGH (ref 36.0–46.0)
Hemoglobin: 18.2 g/dL — ABNORMAL HIGH (ref 12.0–15.0)
MCH: 30.6 pg (ref 26.0–34.0)
MCHC: 34.1 g/dL (ref 30.0–36.0)
MCV: 89.6 fL (ref 80.0–100.0)
Platelets: 184 10*3/uL (ref 150–400)
RBC: 5.95 MIL/uL — ABNORMAL HIGH (ref 3.87–5.11)
RDW: 13.1 % (ref 11.5–15.5)
WBC: 6.2 10*3/uL (ref 4.0–10.5)
nRBC: 0 % (ref 0.0–0.2)

## 2020-07-28 LAB — URINALYSIS, ROUTINE W REFLEX MICROSCOPIC
Bilirubin Urine: NEGATIVE
Glucose, UA: NEGATIVE mg/dL
Hgb urine dipstick: NEGATIVE
Ketones, ur: NEGATIVE mg/dL
Nitrite: NEGATIVE
Protein, ur: NEGATIVE mg/dL
Specific Gravity, Urine: 1.009 (ref 1.005–1.030)
pH: 5 (ref 5.0–8.0)

## 2020-07-28 MED ORDER — LORAZEPAM 1 MG PO TABS
1.0000 mg | ORAL_TABLET | Freq: Once | ORAL | Status: DC
  Filled 2020-07-28: qty 1

## 2020-07-28 NOTE — ED Notes (Signed)
Got patient into a gown on the monitor patient is resting with call bell in reach 

## 2020-07-28 NOTE — ED Provider Notes (Signed)
[facility] EMERGENCY DEPARTMENT Provider Note   CSN: [id number] Arrival date & time: 07/28/20  1157     History Chief Complaint  Patient presents with  . Dizziness    Monica Rivers is a 75 y.o. 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  I reviewed patient's medical record and she brought to the ED on 07/19/2020 as a code stroke and was initiated on tPA by neurologist, Dr. Quinn Axe.  Echocardiogram was negative for PFO or other structural abnormalities.  No LVO or significant stenosis/dissection on CTA head and neck.  Patient was seen by PT and OT who assisted her with gait and visual disturbances.  She was started on 150 mg daily Wellbutrin, no history of epilepsy.  She was ultimately discharged home with continued outpatient PT and OT as well as aspirin and Plavix.  She was encouraged to follow-up with [facility] in 4 weeks.  Patient seen in triage and work-up initiated with CT head in addition to basic laboratory work-up and EKG.  On my examination, patient denies any room spinning dizziness.  Instead, she is endorsing disequilibrium.  She was discharged home with a walker and was doing okay.  She improved to the point that she was able to ambulate with a cane.  However over the course of the past 2 to 3 days, she has experienced progressively worsening ataxia in context of disequilibrium.  She feels as though she is standing on a boat that is rocking.  She denies any actual room spinning dizziness or diplopia.  She states that she did have visual deficits during her recent admission, but those have been improving.    She does endorse a history of seasonal allergies as well as ear fullness.  She is unclear as to whether or not that is a component.  However, she has never experienced this  sort of disequilibrium in the past.  She and her husband tried to call [facility] regarding her new symptoms, but they would not provide her with recommendations or advice given that she had not yet established care there even though she was seen regularly and ultimately discharged home from Dr. Leonie Man, neurologist at [facility].  They did not know what else to do except return to the ER for evaluation.  She denies any new numbness, weakness, diplopia, or other focal deficits aside from her disequilibrium.  Her husband is at bedside and states that she has already fallen once and appears as though she would fall on other occasions.  HPI     No past medical history on file.  Patient Active Problem List   Diagnosis Date Noted  . Acute ischemic stroke ([place]) 07/19/2020     OB History   No obstetric history on file.     No family history on file.     Home Medications Prior to Admission medications   Medication Sig Start Date End Date Taking? Authorizing Provider  aspirin EC 81 MG EC tablet Take 1 tablet (81 mg total) by mouth daily. Swallow whole. 07/22/20   Freida Busman, MD  buPROPion (WELLBUTRIN XL) 150 MG 24 hr tablet Take 1 tablet (150 mg total) by mouth daily. 07/21/20   Freida Busman, MD  clopidogrel (PLAVIX) 75 MG tablet Take 1 tablet (75 mg total) by mouth daily. Will need refills from  PCP 07/22/20   Freida Busman, MD  losartan-hydrochlorothiazide (HYZAAR) 100-25 MG tablet Take 1 tablet by mouth daily. 06/06/20   [provider]  nicotine (NICODERM CQ - DOSED IN MG/24 HOURS) 14 mg/24hr patch Place 1 patch (14 mg total) onto the skin daily. 07/22/20   Freida Busman, MD    Allergies    Morphine and related, Other, Codeine, and Penicillin g  Review of Systems   Review of Systems  All other systems reviewed and are negative.   Physical Exam Updated Vital Signs BP 139/69   Pulse 66   Temp 98.4 F (36.9 C) (Oral)   Resp 14   Ht 5\' 3"  (1.6 m)    Wt 73 kg   SpO2 99%   BMI 28.52 kg/m   Physical Exam Vitals and nursing note reviewed. Exam conducted with a chaperone present.  Constitutional:      Appearance: Normal appearance.  HENT:     Head: Normocephalic and atraumatic.  Eyes:     General: No scleral icterus.    Conjunctiva/sclera: Conjunctivae normal.  Pulmonary:     Effort: Pulmonary effort is normal.  Skin:    General: Skin is dry.  Neurological:     Mental Status: She is alert and oriented to person, place, and time.     GCS: GCS eye subscore is 4. GCS verbal subscore is 5. GCS motor subscore is 6.     Sensory: No sensory deficit.     Motor: No weakness.     Gait: Gait abnormal.     Comments: PERRL.  EOMs largely intact, mild difficulty with upward and downward gaze bilaterally.  Patient is able to move all extremities with strength intact against resistance.  Sensation is grossly intact and symmetric throughout.  Positive Romberg exam with sway while standing.  Finger-to-nose cerebellar exam with mild difficulty with patient's rightward gaze.  Smiles symmetrically.  No dysarthria.  Psychiatric:        Mood and Affect: Mood normal.        Behavior: Behavior normal.        Thought Content: Thought content normal.     ED Results / Procedures / Treatments   Labs (all labs ordered are listed, but only abnormal results are displayed) Labs Reviewed  BASIC METABOLIC PANEL - Abnormal; Notable for the following components:      Result Value   Glucose, Bld 136 (*)    BUN 31 (*)    Creatinine, Ser 1.21 (*)    Calcium 10.5 (*)    GFR, Estimated 47 (*)    All other components within normal limits  CBC - Abnormal; Notable for the following components:   RBC 5.95 (*)    Hemoglobin 18.2 (*)    HCT 53.3 (*)    All other components within normal limits  URINALYSIS, ROUTINE W REFLEX MICROSCOPIC - Abnormal; Notable for the following components:   Color, Urine STRAW (*)    APPearance HAZY (*)    Leukocytes,Ua SMALL (*)     Bacteria, UA RARE (*)    All other components within normal limits  CBG MONITORING, ED    EKG None  Radiology CT HEAD WO CONTRAST  Result Date: 07/28/2020 CLINICAL DATA:  Dizziness. EXAM: CT HEAD WITHOUT CONTRAST TECHNIQUE: Contiguous axial images were obtained from the base of the skull through the vertex without intravenous contrast. COMPARISON:  07/20/2020 FINDINGS: Brain: No evidence of acute infarction, hemorrhage, hydrocephalus, extra-axial collection or mass lesion/mass effect. Patchy areas of  white matter hypoattenuation are noted consistent with mild chronic microvascular ischemic change, stable compared to the prior head CT. Vascular: No hyperdense vessel or unexpected calcification. Skull: Normal. Negative for fracture or focal lesion. Sinuses/Orbits: Globes and orbits are unremarkable. Sinuses are clear. Other: None. IMPRESSION: 1. No acute intracranial abnormalities. 2. Chronic small vessel ischemic change. No change from the prior head CT. Electronically Signed   By: Lajean Manes M.D.   On: 07/28/2020 13:30   MR BRAIN WO CONTRAST  Result Date: 07/28/2020 CLINICAL DATA:  Neuro deficit, acute, stroke suspected. Additional history provided: Disequilibrium for 2-3 days. EXAM: MRI HEAD WITHOUT CONTRAST TECHNIQUE: Multiplanar, multiecho pulse sequences of the brain and surrounding structures were obtained without intravenous contrast. COMPARISON:  Noncontrast head CT 07/28/2020 and earlier. Brain MRI 07/19/2020. FINDINGS: Brain: Cerebral volume is normal for age. Subtle persistent restricted diffusion at site of a known subacute infarct within the medial right thalamus and upper right midbrain. No interval acute infarct is identified. Redemonstrated chronic lacunar infarcts within bilateral basal ganglia. Background moderate T2/FLAIR hyperintensity within the cerebral white matter, nonspecific but compatible with chronic small vessel ischemic disease. To a lesser degree, chronic small vessel  ischemic changes are also present within the pons. Redemonstrated punctate chronic infarcts within the bilateral cerebellar hemispheres. No evidence of intracranial mass. No chronic intracranial blood products. No extra-axial fluid collection. No midline shift. Vascular: Expected proximal arterial flow voids. Skull and upper cervical spine: No focal marrow lesion. Incompletely assessed upper cervical spondylosis. A posterior disc osteophyte at C4-C5 contributes to at least moderate spinal canal stenosis, and there is likely at least mild spinal cord flattening at this level. Sinuses/Orbits: Visualized orbits show no acute finding. Visualized orbits show no acute finding. Prior right lens replacement. Trace mucosal thickening and small mucous retention cyst within the left maxillary sinus. IMPRESSION: Subtle persistent restricted diffusion at site of a known subacute infarct within the medial right thalamus and upper right midbrain. No evidence of interval acute infarction. Otherwise stable noncontrast MRI appearance of the brain as compared to 07/19/2020. Chronic lacunar infarcts within the bilateral basal ganglia. Background moderate cerebral white matter chronic small vessel ischemic disease. Redemonstrated punctate chronic infarcts within the bilateral cerebellar hemispheres. Mild left maxillary sinus disease, as described. Incompletely assessed cervical spondylosis. At C4-C5, posterior disc osteophyte complex contributes to at least moderate spinal canal stenosis and there is likely at least mild spinal cord flattening at this level. Electronically Signed   By: Kellie Simmering DO   On: 07/28/2020 17:48    Procedures Procedures   Medications Ordered in ED Medications  LORazepam (ATIVAN) tablet 1 mg (1 mg Oral Not Given 07/28/20 1714)    ED Course  I have reviewed the triage vital signs and the nursing notes.  Pertinent labs & imaging results that were available during my care of the patient were reviewed  by me and considered in my medical decision making (see chart for details).  Clinical Course as of 07/28/20 1917  Wed Jul 28, 2020  1838 I once again spoke with Dr. Cheral Marker.  He recommends that we set up urgent referral to [facility] for ongoing evaluation and management.  He also thinks that she might benefit from referral to ENT as this could be peripheral vertigo given lack of any central cause on MRI. [GG]    Clinical Course User Index [GG] Corena Herter, PA-C   MDM Rules/Calculators/A&P  PALMYRA ROGACKI was evaluated in Emergency Department on 07/28/2020 for the symptoms described in the history of present illness. She was evaluated in the context of the global COVID-19 pandemic, which necessitated consideration that the patient might be at risk for infection with the SARS-CoV-2 virus that causes COVID-19. Institutional protocols and algorithms that pertain to the evaluation of patients at risk for COVID-19 are in a state of rapid change based on information released by regulatory bodies including the CDC and federal and state organizations. These policies and algorithms were followed during the patient's care in the ED.  I personally reviewed patient's medical chart and all notes from triage and staff during today's encounter. I have also ordered and reviewed all labs and imaging that I felt to be medically necessary in the evaluation of this patient's complaints and with consideration of their physical exam. If needed, translation services were available and utilized.   Patient with new disequilibrium that is progressively worsening over the course of 2 to 3 days.  She denies the room spinning dizziness that she was experiencing during her recent hospitalization for stroke.  We will consult neurology.    I consulted with Dr. Cheral Marker who recommended MRI brain without contrast for further evaluation.    MRI demonstrates subtle persistent restricted diffusion at site of the  known subacute infarct within the medial right thalamus and upper right midbrain.  No evidence of interval acute infarction.  Otherwise stable noncontrast MRI.  I once again spoke with Dr. Cheral Marker.  He recommends that we set up urgent referral to [facility] for ongoing evaluation and management.  He also thinks that she might benefit from referral to ENT as this could be peripheral vertigo given lack of any central cause on MRI.  I discussed this with patient.  She states that she feels comfortable going home and using her walker until she can be seen by neurology.  Her husband can assist her with transitions.  She declines referral to ENT.  States that she has a history of vertigo in which she had room spinning dizziness and nausea symptoms.  States this feels much different.  Described warm to equilibrium.  She states that she does have a history of sinus congestion and I explained that eustachian tube dysfunction can also cause disequilibrium.  Recommending that she initiate antihistamines and Flonase.  We will place ambulatory referral to [facility].  Patient to continue with all other prescribed medications.  ER return precautions discussed.  Patient voices understanding and is agreeable.  Final Clinical Impression(s) / ED Diagnoses Final diagnoses:  Disequilibrium    Rx / DC Orders ED Discharge Orders         Ordered    Ambulatory referral to Neurology       Comments: An appointment is requested in approximately: 2 days. Patient was recently admitted to the hospital for acute infarct.  Discharged home with Plavix and aspirin.  Been taking her medications as directed.  After she had a period of improvement, she now is endorsing severe disequilibrium.  She tried to get in with a clinic, but was declined because she had not yet established care.  She was followed by Dr. Leonie Man and ultimately discharged from him when she had been admitted for acute infarct.  Repeat MRI obtained here in the ED was unremarkable.    07/28/20 1915           Corena Herter, PA-C 07/28/20 1917    Arnaldo Natal, MD 07/31/20 [phone]

## 2020-07-28 NOTE — Discharge Instructions (Addendum)
Fortunately there is no cerebellar infarct or any other new stroke.  However, given your disequilibrium, I still would like you to call [facility] to schedule appointment in the next few days.  I have placed referral to them emphasizing the importance of close follow-up.  They should call you soon.  You describe a history of sinus congestion and your disequilibrium (unsteadiness on your feet) could be reflective of eustachian tube dysfunction secondary to sinus congestion.  Please initiate antihistamines and Flonase.  These medications can be obtained over-the-counter.  Continue take all of your other medications, as directed.  Ambulate with caution and assistance until your symptoms improve.  Return to the ER or seek immediate medical attention should you experience any new or worsening symptoms.

## 2020-07-28 NOTE — ED Triage Notes (Signed)
Pt presents s/p stroke and TPA admin last Monday for eval of loss of balance x 2 days. Endorses dizziness throughout her recovery, but states two days ago her dizziness changed and is much worse when moving around. One fall as a result. Endorses fullness in ears.

## 2020-07-28 NOTE — ED Notes (Signed)
Pt transported to MRI 

## 2020-07-28 NOTE — ED Notes (Signed)
Pt discharged and wheeled out of the ED in a wheel chair without difficulty. 

## 2020-07-28 NOTE — ED Provider Notes (Signed)
Emergency Medicine Provider Triage Evaluation Note  Monica Rivers [facility] 75 y.o. female was evaluated in triage.  Pt complains of presents for evaluation of dizziness, feeling off balance.  She had a recent stroke about a week ago and states that she had some residual balance issues since then but feels like after the last 2 days, symptoms has worsened.  She states whenever she moves her head or gets up and walks, she feels off balance.  No chest pain, difficulty breathing, numbness/weakness of arms or legs, abdominal pain.  She is on Plavix.  No recent falls.  Review of Systems  Positive: Dizziness  Negative: CP, SOB, abd pain, n/w of extermiteies   Physical Exam  BP 134/82   Pulse 70   Temp 98.2 F (36.8 C) (Oral)   Resp 18   Ht 5\' 4"  (1.626 m)   Wt 65.8 kg   SpO2 100%   BMI 24.89 kg/m  Gen:   Awake, no distress   HEENT:  Atraumatic, horizontal nystagmus. Resp:  Normal effort  Cardiac:  Normal rate  Abd:   Nondistended, nontender  MSK:   Moves extremities without difficulty  Neuro:  Speech clear   Medical Decision Making  Medically screening exam initiated at 3:55 AM.  Appropriate orders placed.  Porchia Sinkler Scheer was informed that the remainder of the evaluation will be completed by another provider, this initial triage assessment does not replace that evaluation, and the importance of remaining in the ED until their evaluation is complete.   Clinical Impression  dizzines    Portions of this note were generated with Dragon dictation software. Dictation errors may occur despite best attempts at proofreading.     Volanda Napoleon, PA-C 07/28/20 1220    Arnaldo Natal, MD 07/28/20 1359

## 2020-07-29 ENCOUNTER — Other Ambulatory Visit: Payer: Self-pay | Admitting: *Deleted

## 2020-07-29 NOTE — Patient Outreach (Signed)
[facility]) Care Management  07/29/2020  Monica Rivers [facility] [birth year] [id number]  [facility] Unsuccessful outreach for EMMI- stroke Referred on 07/27/20 RED ON EMMI ALERT  Red Alert Reason: Feeling worse overall? 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 [facility] RN CM left HIPAA [facility] Portability and Accountability Act) compliant voicemail message along with CM's contact info.   Plan: [facility] RN CM scheduled this patient for another call attempt within 4-7 business days  Mane Consolo L. Lavina Hamman, RN, BSN, [place] Coordinator Office number [phone] Mobile number [phone]  Main [facility] number [phone] Fax number [phone]

## 2020-08-03 ENCOUNTER — Other Ambulatory Visit: Payer: Self-pay

## 2020-08-03 ENCOUNTER — Other Ambulatory Visit: Payer: Self-pay | Admitting: *Deleted

## 2020-08-03 ENCOUNTER — Encounter: Payer: Self-pay | Admitting: *Deleted

## 2020-08-03 NOTE — Patient Outreach (Signed)
[facility]) Care Management  08/03/2020  Monica Rivers [facility] 1946/01/09 [id number]   [facility] outreach for EMMI- stroke Referred on 07/27/20 RED ON EMMI ALERT  Red Alert Reason: Feeling worse overall? 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 Discussed the purpose of the thalamus for motor movement and sensory functions like taste, sight, hearing and touch  Shared differences with home health therapies (physical, occupational, speech and respiratory) She voiced understanding  Discussed the guidelines for home health therapies and encouraged her to speak with her FNP about possible home health speech and physical  therapy Discussed purpose of speech therapy for sensory (eyes/vision), neurological, cognitive services  Discussed physical and speech therapy for ataxia/dizziness/balance services  She voiced understanding and appreciation of information    She reports her eyes were only affected and she  could not read or focus during admission She reports her eyes are better since the ED visit but not dizziness or balance control She can not read and see faces of persons Was having trouble getting down 3 steps to her drive way  No weakness in legs, hands and arms     Mrs [facility] after summary sheet was reviewed with her  Her medications were clarified She now understands why she was encouraged to stop her vitamin D and cl until seen by her pcp  She has all other ordered medicines and is taking as ordered Discussed completing Plavix prescriptions, continuing aspirin 81 mg daily and consulting neurology for updates  She voiced understanding   She reported that she had not been able to get a true hospital follow up appointment and had been scheduled to be seen on 08/18/20  With review of EPIC appointments pt was updated that she is to be seen by pcp on 08/10/20 at 1200 vs Aug 18 2020. She reports she had not been notified of this but recalls her spouse had spoken with pcp office staff during a visit he had recently  She was able to write this down so she can attend the correct appointment Her follow up neurology appointment was confirmed as September 15 2020 at [place] CM provided an overview of [facility] services Pt provided with [facility] RN CM office number plus the  24 hour nurse line number  She was encouraged to outreach for any further symptoms  She voiced appreciation of [facility] RN CM outreach and services to include the nurse line     DME walker, BP cuff   Hypertension (HTN)  She was encouraged to take her BP at  Home before taking her BP med to prevent complicating a possible already low BP that may  lead to dizziness, falls She reports only one BP value of 128/75 taken at home last week  Discuss hypotension in details and answered questions  Encouraged home monitoring, reporting lows (<100/60) to pcp for medication adjustments She voiced understanding and voiced also she is working on smoke cessation She was started on a smoke cessation patch and Wellbutrin 150 mg daily dose from her hospitalization. She was commended for smoke cessation interventions   [facility] RN CM outreach to pcp office [phone] Spoke with Tamela to discuss pt will need to be evaluated for home health vs outpatient as patient safety is a concern with outpatient services, Pt wants to be evaluated if vitamin D needs to be restarted  Sent via confirmed e-mail EMMIs on stroke-preventing secondary stroke, double vision, dizziness, non-vertigo, discharge instructions, stroke rehab exercises, stroke rehab information and dealing with low blood pressure from the drugs you take  Patient Active Problem List   Diagnosis Date Noted  . Acute ischemic stroke ([place]) 07/19/2020  . Statin medication declined by patient 01/05/2020  . Vitamin D insufficiency 08/28/2016  . CKD (chronic kidney disease) stage 3, GFR 30-59 ml/min (HCC) 08/14/2016  . GERD (gastroesophageal reflux disease) 01/31/2016  . Anxiety 08/05/2015  . Hypertension 08/05/2015  . Pre-diabetes 08/05/2015      Plans Patient agrees to care plan and follow up x 1 in 7-10 business days prior to case closure as pt is eligible only for [facility] EMMI services at this time Pt encouraged to return a call to [facility] RN CM prn Goals Addressed              This Visit's Progress     Patient Stated   .  [facility]) Keep or Improve My Strength-Stroke (pt-stated)   On track     Timeframe:  Short-Term Goal Priority:  High Start Date:                     08/03/20        Expected End Date:        08/27/20               Follow Up Date 08/12/20   - arrange in-home help services - eat  healthy to increase strength - increase activity or exercise time a little every week  use walker for ambulation safety,  will monitor for any episodes of hypotension prior to taking HTN med, report low BPs to pcp   Notes:  08/03/20 preferring to postpone outpatient rehab r/t worsening symptoms of dizziness, decrease mobility, resolved visual issues  After review of differences in rehab will speak with pcp on 08/10/20 office visit about possible home health,  Will remain safe with use of walker, will monitor for any episodes of hypotension prior to taking HTN med, report low BPs to pcp       Sherlock Nancarrow L. Lavina Hamman, RN, BSN, [place] Coordinator Office number [phone] Main [facility] number [phone] Fax number [phone]

## 2020-08-12 ENCOUNTER — Other Ambulatory Visit: Payer: Self-pay | Admitting: *Deleted

## 2020-08-12 NOTE — Patient Outreach (Signed)
[facility]) Care Management  08/12/2020  Lavell Ridings [facility] 1946-02-05 [id number]   [facility] unsuccessful outreachfor EMMI- stroke patient Referred on 07/27/20 & 08/06/20 RED ON EMMI ALERT 08/05/20 Day #13 Thursday 1000  Red Alert Reason: Had follow up appointment? No  and  RED ON EMMI ALERT  07/26/20 Day # 3 1000 Monday  Red Alert Reason: Feeling worse overall? 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 [facility] RN CM left HIPAA [facility] Portability and Accountability Act) compliant voicemail message along with CM's contact info.   Plan: [facility] RN CM scheduled this patient for another call attempt within 4-7 business days  Ramla Hase L. Lavina Hamman, RN, BSN, [place] Coordinator Office number [phone] Mobile number [phone]  Main [facility] number [phone] Fax number [phone]

## 2020-08-20 ENCOUNTER — Other Ambulatory Visit: Payer: Self-pay

## 2020-08-20 ENCOUNTER — Other Ambulatory Visit: Payer: Self-pay | Admitting: *Deleted

## 2020-08-20 ENCOUNTER — Encounter: Payer: Self-pay | Admitting: *Deleted

## 2020-08-20 NOTE — Patient Outreach (Signed)
[facility]) Care Management  08/20/2020  Monica Rivers [facility] [birth year] [id number]   [facility] outreachfor EMMI- stroke patient Referred on 07/27/20 & 08/06/20 RED ON EMMI ALERT 08/05/20 Day #13 Thursday 1000  Red Alert Reason: Had follow up appointment? No  and  RED ON EMMI ALERT  07/26/20 Day # 3 1000 Monday  Red Alert Reason: Feeling worse overall? 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 Monica Rivers has been seen by her primary care provider (PCP) but not followed up with the neurologist yet. Not scheduled to see neurologist until 09/15/20 [facility] RN CM unable to send an in basket message to pcp via EPIC Pt will outreach via my chart to inquire about this referral as she also would like to be seen to prevent or improve her visual concerns She was encouraged by the [facility] OT to rest her eyes as much as possible as they get irritated  Suggestions of meditation and audio books provided She tried with focusing her [place] to stop all outside distractions   Social  Her husband continues to complete lots of home tasks while she rests She also prior to this recent admission was caring for her 74 year old mother Monica Rivers Husband  walks with a cane and does have a history of falls  She is self employed Ecologist)  Smoke cessation  Trying to stop smoking with interventions to include chewing and blowing on a straw and/or and sucking on Tootsie pops. She is having success with this    Received e-mailed [facility] EMMIs (stroke-preventing secondary stroke, double vision, dizziness, non-vertigo, discharge instructions, stroke rehab exercises, stroke rehab information and dealing with low blood pressure from the drugs you take) and [facility] welcome package   cerebrovascular accident (CVA)  She is able to bathe, dress, walk, complete  Activities of daily living (ADLs)  Not reporting balance issues only visual barrier remains Remains safe without falls  Continues to cope well with joking throughout this outreach  Discussed repeat imaging to check on her healing  Discussed reasons for use of statin medications Reviewed her 07/20/20 lipid values which are all normal- This explains why she is not on a statin  Plans Patient agrees to care plan and follow up within then next 30 business days and plan for case closure  Pt encouraged to return a call to [facility] RN CM prn Goals Addressed              This Visit's Progress     Patient Stated   .  [facility]) Keep or Improve My Strength-Stroke (pt-stated)   On track     Timeframe:  Short-Term Goal Priority:  High Start Date:  08/03/20        Expected End Date:        09/23/20               Follow Up Date 09/23/20  Barriers: Knowledge  - arrange in-home help services - eat healthy to increase strength - increase activity or exercise time a little every week  use walker for ambulation safety,  will monitor for any episodes of hypotension prior to taking HTN med, report low BPs to pcp      Notes:  08/20/20 continues home health physical therapy, seen and cleared by home health OT on 08/18/20 who recommended possible referral to a neuro ophthalmologist. Monica Mago has been  seen by her primary care provider (PCP) but not followed up with the neurologist yet. Not scheduled to see neurologist until 09/15/20, Pt to reach out via my chart to pcp Eating healthy. Resting her eyes as much as possible as they get irritated Suggestions of meditation and audio books provided 08/03/20 preferring to postpone outpatient rehab r/t worsening symptoms of dizziness, decrease mobility, resolved visual issues  After review of differences in rehab will speak with pcp on 08/10/20 office visit about possible home health,  Will remain safe with use of walker, will monitor for any episodes of hypotension prior to taking HTN med, report low BPs to pcp        [name] L. Lavina Hamman, RN, BSN, [place] Coordinator Office number [phone] Main [facility] number [phone] Fax number [phone]

## 2020-09-15 ENCOUNTER — Other Ambulatory Visit: Payer: Self-pay

## 2020-09-15 ENCOUNTER — Ambulatory Visit (INDEPENDENT_AMBULATORY_CARE_PROVIDER_SITE_OTHER): Payer: Medicare Other | Admitting: Adult Health

## 2020-09-15 ENCOUNTER — Encounter: Payer: Self-pay | Admitting: Adult Health

## 2020-09-15 VITALS — BP 151/81 | HR 91 | Ht 63.5 in | Wt 171.8 lb

## 2020-09-15 DIAGNOSIS — I69398 Other sequelae of cerebral infarction: Secondary | ICD-10-CM | POA: Diagnosis not present

## 2020-09-15 DIAGNOSIS — I639 Cerebral infarction, unspecified: Secondary | ICD-10-CM

## 2020-09-15 DIAGNOSIS — R2689 Other abnormalities of gait and mobility: Secondary | ICD-10-CM

## 2020-09-15 DIAGNOSIS — I10 Essential (primary) hypertension: Secondary | ICD-10-CM

## 2020-09-15 DIAGNOSIS — R7303 Prediabetes: Secondary | ICD-10-CM | POA: Diagnosis not present

## 2020-09-15 DIAGNOSIS — I6381 Other cerebral infarction due to occlusion or stenosis of small artery: Secondary | ICD-10-CM

## 2020-09-15 DIAGNOSIS — H539 Unspecified visual disturbance: Secondary | ICD-10-CM

## 2020-09-15 NOTE — Progress Notes (Signed)
[facility] [address] [place]. [place] [address] [phone]       HOSPITAL FOLLOW UP NOTE  [name] Date of Birth:  04-17-1945 Medical Record Number:  [id number]   Reason for Referral:  hospital stroke follow up    SUBJECTIVE:   CHIEF COMPLAINT:  Chief Complaint  Patient presents with   Cerebrovascular Accident    Patient reports that she is still having a problem with her balance but her vision has improved.     HPI:   [name] is a 75 y.o. female with history of HTN, GERD, CKD III, Vitamin D deficiency, tobacco abuse, anxiety, and "pre" DM II with last HbA1c 6.1% 3/22 who presented on 07/19/2020 with rapid onset visual disturbances.  Personally reviewed hospitalization pertinent progress notes, lab work and imaging with summary provided.  Evaluated by Dr. 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  Recommended DAPT for 3 weeks and Plavix alone.  HTN stable.  LDL 40.  A1c 6.2.  In addition to acute stroke, MRI also showed chronic infarcts within the posterior limb of left internal capsule and right cerebellar hemisphere. CT A Head and neck was negative for LVO and/or significant stenosis.  2D echo negative for structural abnormalities with EF 60 to 65%.  Tobacco cessation counseling provided.  Evaluated therapies who recommended outpatient PT/OT and discharged home in stable condition.  Today, 09/15/2020, [name] is being seen for hospital follow-up unaccompanied.  She has been gradually recovering with continued vision difficulties and unsteadiness/imbalance but does endorse gradual improvement.  She denies any true dizziness or vertigo.  She continues to use a rolling walker.  She still has difficulty looking up with delayed focusing and eyestrain if focusing on something for too long but denies any other visual symptoms.  She is currently working  with PT at [facility] without [place] in [place]. Per pt, she was evaluated by OT and was told there was nothing they could do for her and was advised to be seen by neuro-ophthalmology.  She was evaluated in the ED on 07/28/2020 with worsening disequilibrium with MRI negative for acute stroke and advised to follow-up with our office.  She denies any worsening of symptoms since that time or new stroke/TIA symptoms.  Completed 3 weeks DAPT and remains on Plavix alone without associated side effects.  Blood pressure today 151/81.  Endorses complete tobacco cessation since discharge.  No further concerns at this time.    ROS:   14 system review of systems performed and negative with exception of those listed in HPI  PMH: No past medical history on file.  PSH: No past surgical history on file.  Social History:  Social History   Socioeconomic History   Marital status: Married    Spouse name: Monica Rivers   Number of children: Not on file   Years of education: hihg scool    Highest education level: Not on file  Occupational History    Comment: self employed- desk job   Tobacco Use   Smoking status: Every Day    Pack years: 0.00    Types: Cigarettes   Smokeless tobacco: Never  Substance and Sexual Activity   Alcohol use: Not on file   Drug use: Not on file   Sexual activity: Yes    Partners: Male    Birth control/protection: None  Other Topics Concern   Not on file  Social History Narrative   Cares for her 55 yr old mother    Monica Rivers Husband walks with a cane and does have a history of falls    Social Determinants of Radio broadcast assistant Strain: Low Risk    Difficulty of Paying Living Expenses: Not hard at all  Food Insecurity: No Food Insecurity   Worried About Charity fundraiser in the Last Year: Never true   Arboriculturist in the Last Year: Never true  Transportation Needs: No Transportation Needs   Lack of Transportation (Medical): No   Lack of Transportation (Non-Medical): No   Physical Activity: Not on file  Stress: No Stress Concern Present   Feeling of Stress : Only a little  Social Connections: Engineer, building services of Communication with Friends and Family: More than three times a week   Frequency of Social Gatherings with Friends and Family: More than three times a week   Attends Religious Services: 1 to 4 times per year   Active Member of [employer] or Organizations: Yes   Attends Archivist Meetings: 1 to 4 times per year   Marital Status: Married  Human resources officer Violence: Not At Risk   Fear of Current or Ex-Partner: No   Emotionally Abused: No   Physically Abused: No   Sexually Abused: No    Family History: No family history on file.  Medications:   Current Outpatient Medications on File Prior to Visit  Medication Sig Dispense Refill   buPROPion (WELLBUTRIN XL) 150 MG 24 hr tablet Take 1 tablet (150 mg total) by mouth daily. 30 tablet 0   clopidogrel (PLAVIX) 75 MG tablet Take 1 tablet (75 mg total) by mouth daily. Will need refills from PCP 30 tablet 0   LORazepam (ATIVAN) 0.5 MG tablet Take by mouth.     losartan-hydrochlorothiazide (HYZAAR) 100-25 MG tablet Take 1 tablet by mouth daily.     nicotine (NICODERM CQ - DOSED IN MG/24 HOURS) 14 mg/24hr patch Place 1 patch (14 mg total) onto the skin daily. 28 patch 0   No current facility-administered medications on file prior to visit.    Allergies:   Allergies  Allergen Reactions   Morphine And Related Nausea Only and Other (See Comments)    EXTREME NAUSEA- "I could not raise my head off of the pillow"   Other Nausea Only and Other (See Comments)    NO SPICY FOODS- Extreme REFLUX!!   Codeine Rash   Penicillin G Rash      OBJECTIVE:  Physical Exam  Vitals:   09/15/20 1412  BP: (!) 151/81  Pulse: 91  Weight: 171 lb 12.8 oz (77.9 kg)  Height: 5' 3.5" (1.613 m)   Body mass index is 29.96 kg/m. No results found.  Post stroke PHQ 2/9 Depression screen PHQ 2/9  09/15/2020  Decreased Interest 0  Down, Depressed, Hopeless 0  PHQ - 2 Score 0     General: well developed, well nourished, very pleasant elderly Caucasian female, seated, in no evident distress Head: head normocephalic and atraumatic.   Neck: supple with no carotid or supraclavicular bruits Cardiovascular: regular rate and rhythm, no murmurs Musculoskeletal: no deformity Skin:  no rash/petichiae Vascular:  Normal pulses all extremities   Neurologic Exam Mental Status: Awake and fully alert.  Fluent speech and language.  Oriented to place and time. Recent and remote memory intact. Attention span, concentration and fund of knowledge appropriate. Mood and affect appropriate.  Cranial Nerves: Fundoscopic  exam reveals sharp disc margins. Pupils equal, briskly reactive to light. Extraocular movements full without nystagmus except slight disconjugate gaze noted with left eye esotropia. Visual fields full to confrontation. Hearing intact. Facial sensation intact. Face, tongue, palate moves normally and symmetrically.  Motor: Normal bulk and tone. Normal strength in all tested extremity muscles Sensory.: intact to touch , pinprick , position and vibratory sensation.  Coordination: Rapid alternating movements normal in all extremities. Finger-to-nose and heel-to-shin performed accurately bilaterally. Gait and Station: Arises from chair without difficulty. Stance is normal. Gait demonstrates normal stride length and mild imbalance with use of rolling walker. Tandem walk and heel toe not attempted due to safety concerns Reflexes: 1+ and symmetric. Toes downgoing.     NIHSS  1 Modified Rankin  2      ASSESSMENT: Monica Rivers is a 75 y.o. 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 Vascular risk factors include prior strokes on imaging, HTN, pre-DM  and tobacco use.      PLAN:  Right medial thalamic stroke:  Residual deficit: Visual disturbance and imbalance.  Continue working with PT ongoing balance exercises.  May consider referral to neuro-ophthalmology if symptoms persist at follow-up visit - also may benefit from being evaluated by a different OT for vision therapy -she plans on continuing to work with PT at this time - request PT/OT exam notes be faxed to office for further review Continue aspirin 81 mg daily for secondary stroke prevention.   Discussed secondary stroke prevention measures and importance of close PCP follow up for aggressive stroke risk factor management  HTN: BP goal <130/90.  Stable on current regimen per PCP Pre-DMII: A1c goal<7.0. Recent A1c 6.2.  Tobacco abuse: completely quit smoking since discharge with use of nicotine patch    Follow up in 3 months or call earlier if needed   CC:  [facility] provider: Dr. Leonie Man PCP: [name], FNP    I spent 43 minutes of face-to-face and non-face-to-face time with patient.  This included previsit chart review including recent hospitalization, lab review, study review, electronic health record documentation, and patient education and discussion regarding recent stroke and likely etiology as well as secondary stroke prevention measures importance of managing stroke risk factors, residual deficits and typical recovery time and answered all other questions to patient satisfaction  Frann Rider, AGNP-BC  [facility] [address] [place], [place] [address]  Phone [phone] Fax [phone] Note: This document was prepared with digital dictation and possible smart phrase technology. Any transcriptional errors that result from this process are unintentional.

## 2020-09-15 NOTE — Patient Instructions (Signed)
Continue working with therapies for hopefully ongoing recovery   Continue clopidogrel 75 mg daily for secondary stroke prevention  Continue to follow up with PCP regarding blood pressure and prediabetes management  Maintain strict control of hypertension with blood pressure goal below 130/90, and pre-diabetes with hemoglobin A1c goal below 7%     Followup in the future with me in 3 months or call earlier if needed     Thank you for coming to see [place] at [facility]. I hope we have been able to provide you high quality care today.  You may receive a patient satisfaction survey over the next few weeks. We would appreciate your feedback and comments so that we may continue to improve ourselves and the health of our patients.

## 2020-09-23 ENCOUNTER — Other Ambulatory Visit: Payer: Self-pay | Admitting: *Deleted

## 2020-09-23 ENCOUNTER — Encounter: Payer: Self-pay | Admitting: *Deleted

## 2020-09-23 ENCOUNTER — Other Ambulatory Visit: Payer: Self-pay

## 2020-09-23 NOTE — Patient Outreach (Signed)
Monica Rivers [facility]) Care Management  09/23/2020  Monica Rivers [facility] 14-Aug-1945 [id number]  [facility] outreach for EMMI- stroke patient with case closure Referred on 07/27/20 & 08/06/20 RED ON EMMI ALERT 08/05/20 Day #13 Thursday 1000  Red Alert Reason: Had follow up appointment? No  and RED ON EMMI ALERT   07/26/20 Day # 3 1000 Monday  Red Alert Reason: Feeling worse overall? Yes       Insurance: medicare and [employer]  [facility] admissions x 1  ED visits x 2 in the last 6 months   [facility] admission[facility]ED 07/28/20 ED disequilibrium- dizziness 07/19/20-07/21/20 ED leading to admission- acute ischemic stroke right medial thalamus and upper midbrain secondary to small vessel disease, diplopia and vision difficulties   Follow up assessment Monica Rivers reports she continues to do well at home She reports plans to ride out with her husband this evening Still following covid precautions Discusses she is not able to handle much stimulation right now such as being in crowds She is still using a walker  Her vision is reported to be a lot better  She did attend her 09/15/20 neurologist office visit She reports during her exam a hesitation in her left eye was noted She reports trouble when looking up She and her neurology staff discussed neuro ophthalmology services.  The discussion included waiting 4-6 months as gradual visual improvements will occur. She also will need a cataract removed  She reports less irritation/eye strain and is able to read and can watch TV for 1 hour intervals  She reports her balance has improved to but still has episodes of unsteadiness that is reported to feel "like I'm tipsy"  [facility] RN CM discussed Vestibular therapy  Continues to work with PT on balance Discussed neuropathy/nerve damage     Pcp office visit is on 09/30/20  Discussed THN progression and THN available services She agrees to [name] complex case closure vs referral to [name] disease  management with an option to outreach to [name] RN CM as needed She voiced appreciation of outreaches and services rendered   Plans Patient agrees to no follow up at this time Pt encouraged to return a call to [facility] RN CM prn   Goals Addressed               This Visit's Progress     Patient Stated     COMPLETED: [name]) Keep or Improve My Strength-Stroke (pt-stated)   On track     Timeframe:  Short-Term Goal Priority:  High Start Date:                     08/03/20        Expected End Date:        09/23/20               Follow Up Date 09/23/20 Barriers: Knowledge   - arrange in-home help services - eat healthy to increase strength - increase activity or exercise time a little every week  use walker for ambulation safety,  will monitor for any episodes of hypotension prior to taking HTN med, report low BPs to pcp     Notes:  09/23/20 Goal completed reports improvement of strength continues to work with her therapists.on balance She is staying active (rides with husband) Uses her walker No falls 08/20/20 continues home health physical therapy, seen and cleared by home health OT on 08/18/20 who recommended possible referral to a neuro ophthalmologist.  Monica Monica Rivers has been seen by her primary care provider (PCP) but not followed up with the neurologist yet. Not scheduled to see neurologist until 09/15/20, Pt to reach out via my chart to pcp Eating healthy. Resting her eyes as much as possible as they get irritated Suggestions of meditation and audio books provided 08/03/20 preferring to postpone outpatient rehab r/t worsening symptoms of dizziness, decrease mobility, resolved visual issues  After review of differences in rehab will speak with pcp on 08/10/20 office visit about possible home health,  Will remain safe with use of walker, will monitor for any episodes of hypotension prior to taking HTN med, report low BPs to pcp          Monica Rivers L. Lavina Hamman, RN, BSN, [place] Coordinator Office number [phone] [facility] number [phone] Fax number [phone]

## 2020-09-24 NOTE — Progress Notes (Signed)
I agree with the above plan 

## 2020-10-19 ENCOUNTER — Other Ambulatory Visit: Payer: Self-pay

## 2020-10-19 NOTE — Patient Outreach (Signed)
Monica Rivers) Care Management  10/19/2020  Monica Rivers [birth year] [id number]   First telephone outreach attempt to obtain mRS. No answer. Left message for returned call.  Philmore Pali [facility] Management Assistant [phone]

## 2020-10-25 ENCOUNTER — Other Ambulatory Visit: Payer: Self-pay

## 2020-10-25 ENCOUNTER — Encounter: Payer: Self-pay | Admitting: *Deleted

## 2020-10-25 NOTE — Patient Outreach (Signed)
[facility]) Care Management  10/25/2020  Monica Rivers [facility] April 22, 1945 [id number]   Second telephone outreach attempt to obtain mRS. No answer. Left message for returned call.  Philmore Pali [facility] Management Assistant [phone]

## 2020-10-28 ENCOUNTER — Other Ambulatory Visit: Payer: Self-pay

## 2020-10-28 NOTE — Patient Outreach (Signed)
[facility]) Care Management  10/28/2020  Monica Rivers [facility] 25-Aug-1945 [id number]   3 outreach attempts were completed to obtain mRs. mRs could not be obtained because patient never returned my calls. mRs=7    [place] Management Assistant [phone]

## 2020-12-14 ENCOUNTER — Encounter: Payer: Self-pay | Admitting: Adult Health

## 2020-12-14 ENCOUNTER — Ambulatory Visit (INDEPENDENT_AMBULATORY_CARE_PROVIDER_SITE_OTHER): Payer: Medicare Other | Admitting: Adult Health

## 2020-12-14 VITALS — BP 140/80 | HR 96 | Ht 63.0 in | Wt 176.0 lb

## 2020-12-14 DIAGNOSIS — R29818 Other symptoms and signs involving the nervous system: Secondary | ICD-10-CM

## 2020-12-14 DIAGNOSIS — R2689 Other abnormalities of gait and mobility: Secondary | ICD-10-CM | POA: Diagnosis not present

## 2020-12-14 DIAGNOSIS — I69398 Other sequelae of cerebral infarction: Secondary | ICD-10-CM | POA: Diagnosis not present

## 2020-12-14 DIAGNOSIS — I639 Cerebral infarction, unspecified: Secondary | ICD-10-CM

## 2020-12-14 DIAGNOSIS — I6381 Other cerebral infarction due to occlusion or stenosis of small artery: Secondary | ICD-10-CM

## 2020-12-14 NOTE — Progress Notes (Signed)
[facility] [address] [place]. [place] [address] [phone]       STROKE FOLLOW UP NOTE  [name] Date of Birth:  04/26/1945 Medical Record Number:  [id number]   Reason for Referral: stroke follow up    SUBJECTIVE:   CHIEF COMPLAINT:  Chief Complaint  Patient presents with   Follow-up    Rm  3 alone Pt is well and stable, making progress and getting better      HPI:   [name] is a 75 y.o. 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 MRI also showed chronic infarcts within the posterior limb of left internal capsule and right cerebellar hemisphere. CT A Head and neck was negative for LVO and/or significant stenosis.  2D echo negative for structural abnormalities with EF 60 to 65%.   Today, 12/14/2020, [name] returns for follow-up after prior initial hospital follow-up 3 months ago.  Overall doing well.  Reports residual mild unsteadiness/imbalance but overall greatly improving.  Currently ambulating with a cane denies any recent falls.  Completed therapies and continues to do HEP routinely.  Denies residual visual difficulties. 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  She has had difficulty with overstimulation such as riding in a car, loud surroundings or crowded areas. She avoids certain situations that can cause overstimulation.  Denies new stroke/TIA symptoms.  Compliant on Plavix without side effects.  Blood pressure today 140/80.  Continued tobacco cessation.  No further concerns at this time    Update 09/15/2020 JM: [name] is being seen for hospital follow-up unaccompanied.  She has been gradually recovering with continued vision  difficulties and unsteadiness/imbalance but does endorse gradual improvement.  She denies any true dizziness or vertigo.  She continues to use a rolling walker.  She still has difficulty looking up with delayed focusing and eyestrain if focusing on something for too long but denies any other visual symptoms.  She is currently working with PT at [facility] without [place] in [place]. Per pt, she was evaluated by OT and was told there was nothing they could do for her and was advised to be seen by neuro-ophthalmology.  She was evaluated in the ED on 07/28/2020 with worsening disequilibrium with MRI negative for acute stroke and advised to follow-up with our office.  She denies any worsening of symptoms since that time or new stroke/TIA symptoms.  Completed 3 weeks DAPT and remains on Plavix alone without associated side effects.  Blood pressure today 151/81.  Endorses complete tobacco cessation since discharge.  No further concerns at this time.    ROS:   14 system review of systems performed and negative with exception of those listed in HPI  PMH: History reviewed. No pertinent past medical history.  PSH: History reviewed. No pertinent surgical history.  Social History:  Social History   Socioeconomic History   Marital status: Married    Spouse name: [name]   Number of children: Not on file   Years of education: hihg scool    Highest education level: Not on file  Occupational History    Comment: self employed- desk job   Tobacco Use   Smoking status: Every Day    Types: Cigarettes  Smokeless tobacco: Never  Substance and Sexual Activity   Alcohol use: Not on file   Drug use: Not on file   Sexual activity: Yes    Partners: Male    Birth control/protection: None  Other Topics Concern   Not on file  Social History Narrative   Cares for her 45 yr old mother    [name] Husband walks with a cane and does have a history of falls    Social Determinants of Radio broadcast assistant Strain:  Low Risk    Difficulty of Paying Living Expenses: Not hard at all  Food Insecurity: No Food Insecurity   Worried About Charity fundraiser in the Last Year: Never true   Arboriculturist in the Last Year: Never true  Transportation Needs: No Transportation Needs   Lack of Transportation (Medical): No   Lack of Transportation (Non-Medical): No  Physical Activity: Not on file  Stress: No Stress Concern Present   Feeling of Stress : Only a little  Social Connections: Engineer, building services of Communication with Friends and Family: More than three times a week   Frequency of Social Gatherings with Friends and Family: More than three times a week   Attends Religious Services: 1 to 4 times per year   Active Member of [employer] or Organizations: Yes   Attends Archivist Meetings: 1 to 4 times per year   Marital Status: Married  Human resources officer Violence: Not At Risk   Fear of Current or Ex-Partner: No   Emotionally Abused: No   Physically Abused: No   Sexually Abused: No    Family History: History reviewed. No pertinent family history.  Medications:   Current Outpatient Medications on File Prior to Visit  Medication Sig Dispense Refill   buPROPion (WELLBUTRIN XL) 150 MG 24 hr tablet Take 1 tablet (150 mg total) by mouth daily. 30 tablet 0   clopidogrel (PLAVIX) 75 MG tablet Take 1 tablet (75 mg total) by mouth daily. Will need refills from PCP 30 tablet 0   LORazepam (ATIVAN) 0.5 MG tablet Take by mouth.     losartan-hydrochlorothiazide (HYZAAR) 100-25 MG tablet Take 1 tablet by mouth daily.     No current facility-administered medications on file prior to visit.    Allergies:   Allergies  Allergen Reactions   Morphine And Related Nausea Only and Other (See Comments)    EXTREME NAUSEA- "I could not raise my head off of the pillow"   Other Nausea Only and Other (See Comments)    NO SPICY FOODS- Extreme REFLUX!!   Codeine Rash   Penicillin G Rash       OBJECTIVE:  Physical Exam  Vitals:   12/14/20 1459  BP: 140/80  Pulse: 96  Weight: 176 lb (79.8 kg)  Height: 5\' 3"  (1.6 m)    Body mass index is 31.18 kg/m. No results found.  General: well developed, well nourished, very pleasant elderly Caucasian female, seated, in no evident distress Head: head normocephalic and atraumatic.   Neck: supple with no carotid or supraclavicular bruits Cardiovascular: regular rate and rhythm, no murmurs Musculoskeletal: no deformity Skin:  no rash/petichiae Vascular:  Normal pulses all extremities   Neurologic Exam Mental Status: Awake and fully alert.  Fluent speech and language.  Oriented to place and time. Recent and remote memory intact. Attention span, concentration and fund of knowledge appropriate. Mood and affect appropriate.  Cranial Nerves: Pupils equal, briskly reactive to light. Extraocular movements  full without nystagmus. Visual fields full to confrontation. Hearing intact. Facial sensation intact. Face, tongue, palate moves normally and symmetrically.  Motor: Normal bulk and tone. Normal strength in all tested extremity muscles Sensory.: intact to touch , pinprick , position and vibratory sensation.  Coordination: Rapid alternating movements normal in all extremities. Finger-to-nose and heel-to-shin performed accurately bilaterally. Gait and Station: Arises from chair without difficulty. Stance is normal. Gait demonstrates normal stride length and balance with use of cane.  Able to perform tandem walk and heel toe with only mild difficulty Reflexes: 1+ and symmetric. Toes downgoing.         ASSESSMENT: Monica Rivers is a 75 y.o. 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 Vascular risk factors include prior strokes on imaging, HTN, pre-DM and tobacco use.      PLAN:  Right medial thalamic stroke:  Residual deficit: Gait impairment with imbalance and difficulties with sensory overload.  Encouraged continued HEP for hopeful further recovery.  Provided information regarding sensory overload after stroke and certain things that she can do to help improve symptoms.  Also recommended trialing Ativan (already prescribed by PCP) as needed for certain situations which she knows will increase anxiety or difficulty tolerating.   Continue clopidogrel 75 mg daily for secondary stroke prevention.   Discussed secondary stroke prevention measures and importance of close PCP follow up for aggressive stroke risk factor management  HTN: BP goal <130/90.  Stable on current regimen per PCP Tobacco abuse: Continued complete tobacco cessation and highly encouraged continuation    Follow up in 6 months or call earlier if needed   CC:  [facility] provider: Dr. Leonie Man PCP: [name], FNP    I spent 38 minutes of face-to-face and non-face-to-face time with patient.  This included previsit chart review, lab review, study review, electronic health record documentation, and patient education and discussion regarding prior stroke and likely etiology as well as secondary stroke prevention measures and importance of managing stroke risk factors, residual deficits and and possible further recovery and answered all other questions to patient satisfaction  Frann Rider, AGNP-BC  [facility] [address] [place], [place] [address]  Phone [phone] Fax [phone] Note: This document was prepared with digital dictation and possible smart phrase technology. Any transcriptional errors that result from this process are unintentional.

## 2020-12-14 NOTE — Patient Instructions (Signed)
Continue clopidogrel 75 mg daily for secondary stroke prevention  Continue to follow up with PCP regarding blood pressure management  Maintain strict control of hypertension with blood pressure goal below 130/90  Look at information provided regarding overstimulation after a stroke. You may trial use of ativan as needed for certain situations that you know may increase your anxiety or stress     Followup in the future with me in 6 months or call earlier if needed      Thank you for coming to see [place] at [facility]. I hope we have been able to provide you high quality care today.  You may receive a patient satisfaction survey over the next few weeks. We would appreciate your feedback and comments so that we may continue to improve ourselves and the health of our patients.

## 2020-12-15 NOTE — Progress Notes (Signed)
I agree with the above plan 

## 2021-06-14 ENCOUNTER — Other Ambulatory Visit: Payer: Self-pay

## 2021-06-14 ENCOUNTER — Ambulatory Visit (INDEPENDENT_AMBULATORY_CARE_PROVIDER_SITE_OTHER): Payer: Medicare Other | Admitting: Adult Health

## 2021-06-14 ENCOUNTER — Encounter: Payer: Self-pay | Admitting: Adult Health

## 2021-06-14 VITALS — BP 132/70 | HR 81 | Ht 63.5 in | Wt 179.0 lb

## 2021-06-14 DIAGNOSIS — I69398 Other sequelae of cerebral infarction: Secondary | ICD-10-CM

## 2021-06-14 DIAGNOSIS — M25472 Effusion, left ankle: Secondary | ICD-10-CM

## 2021-06-14 DIAGNOSIS — R2689 Other abnormalities of gait and mobility: Secondary | ICD-10-CM | POA: Diagnosis not present

## 2021-06-14 DIAGNOSIS — I6381 Other cerebral infarction due to occlusion or stenosis of small artery: Secondary | ICD-10-CM | POA: Diagnosis not present

## 2021-06-14 NOTE — Patient Instructions (Addendum)
Continue clopidogrel 75 mg daily for secondary stroke prevention ? ?Maintain strict control of hypertension with blood pressure goal below 130/90, diabetes with hemoglobin A1c goal below 7.0 % and cholesterol with LDL cholesterol (bad cholesterol) goal below 70 mg/dL.  ? ?Signs of a Stroke? Follow the BEFAST method:  ?Balance Watch for a sudden loss of balance, trouble with coordination or vertigo ?Eyes Is there a sudden loss of vision in one or both eyes? Or double vision?  ?Face: Ask the person to smile. Does one side of the face droop or is it numb?  ?Arms: Ask the person to raise both arms. Does one arm drift downward? Is there weakness or numbness of a leg? ?Speech: Ask the person to repeat a simple phrase. Does the speech sound slurred/strange? Is the person confused ? ?Time: If you observe any of these signs, call [phone]. ?  ? ? ? ? ? ?Thank you for coming to see [place] at [facility]. I hope we have been able to provide you high quality care today. ? ?You may receive a patient satisfaction survey over the next few weeks. We would appreciate your feedback and comments so that we may continue to improve ourselves and the health of our patients. ? ?

## 2021-06-14 NOTE — Progress Notes (Signed)
?[facility] ?[address] ?[place]. [place] [address] ?[phone] ? ?     STROKE FOLLOW UP NOTE ? ?[name] ?Date of Birth:  27-Aug-1945 ?Medical Record Number:  [id number]  ? ?Reason for Referral: stroke follow up ? ? ? ?SUBJECTIVE: ? ? ?CHIEF COMPLAINT:  ?Chief Complaint  ?Patient presents with  ? Follow-up  ?  RM 3 alone here for 6 month f/u. Pt reports she has been doing the same since last visit.   ? ? ? ?HPI:  ? ?[name] is a 76 y.o. female with history of right medial thalamic stroke s/p tPA 06/2020, old strokes on imaging, HTN, GERD, CKD III, Vitamin D deficiency, tobacco abuse, anxiety, and new dx of DM II (previously pre-DM).  Routinely followed for stroke follow-up. ? ? ?Update 06/14/2021 JM: Returns for follow-up visit after prior visit 6 months ago.  Overall stable from stroke standpoint without new stroke/TIA symptoms.  Reports residual mild imbalance but overall stable since prior visit. She is able to ambulate without her cane inside her home but will need cane when outdoors, denies any falls.  Compliant on Plavix without side effects.  Blood pressure today 132/70.  Recent lab work by PCP with A1c 6.8 and direct LDL 30. New dx of DM per PCP, plans to initiate medication regimen if A1c goes above goal of 7. She also mentions low vitamin D level at 27 and questions supplementation for this as she was told at hospital discharge to discontinue. ? ?She also mentions ongoing issue with left ankle swelling which has been present since October - she was seen by PCP with negative xray, swelling improved with compression stockings also slight different in ankle size persists. Reports PCP offered foot and ankle evaluation but she declined as she has no pain and full ROM. Denies any prior injury. Denies DVT type symptoms. Does have some intermittent pain on her shin but no calf pain. Does report chronic varicose veins. She is nervous about a recurring stroke and is concerned  about a potential blood clot. ? ?No further concerns at this time. ? ? ? ? ? ?History provided for reference purposes only ?Update 12/14/2020 JM: [name] returns for follow-up after prior initial hospital follow-up 3 months ago.  Overall doing well.  Reports residual mild unsteadiness/imbalance but overall greatly improving.  Currently ambulating with a cane denies any recent falls.  Completed therapies and continues to do HEP routinely.  Denies residual visual difficulties. 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  She has had difficulty with overstimulation such as riding in a car, loud surroundings or crowded areas. She avoids certain situations that can cause overstimulation.  Denies new stroke/TIA symptoms.  Compliant on Plavix without side effects.  Blood pressure today 140/80.  Continued tobacco cessation.  No further concerns at this time ? ?Initial visit 09/15/2020 JM: [name] is being seen for hospital follow-up unaccompanied.  She has been gradually recovering with continued vision difficulties and unsteadiness/imbalance but does endorse gradual improvement.  She denies any true dizziness or vertigo.  She continues to use a rolling walker.  She still has difficulty looking up with delayed focusing and eyestrain if focusing on something for too long but denies any other visual symptoms.  She is currently working with PT at [facility] without [place] in [place]. Per pt, she was evaluated by OT and was told there was nothing they could do for her and was advised  to be seen by neuro-ophthalmology.  She was evaluated in the ED on 07/28/2020 with worsening disequilibrium with MRI negative for acute stroke and advised to follow-up with our office.  She denies any worsening of symptoms since that time or new stroke/TIA symptoms.  Completed 3 weeks DAPT and remains on Plavix alone without associated side effects.  Blood pressure today 151/81.  Endorses complete tobacco cessation  since discharge.  No further concerns at this time. ? ? ? ?ROS:   ?14 system review of systems performed and negative with exception of those listed in HPI ? ?PMH: No past medical history on file. ? ?PSH: No past surgical history on file. ? ?Social History:  ?Social History  ? ?Socioeconomic History  ? Marital status: Married  ?  Spouse name: [name]  ? Number of children: Not on file  ? Years of education: hihg scool   ? Highest education level: Not on file  ?Occupational History  ?  Comment: self employed- desk job   ?Tobacco Use  ? Smoking status: Every Day  ?  Types: Cigarettes  ? Smokeless tobacco: Never  ?Substance and Sexual Activity  ? Alcohol use: Not on file  ? Drug use: Not on file  ? Sexual activity: Yes  ?  Partners: Male  ?  Birth control/protection: None  ?Other Topics Concern  ? Not on file  ?Social History Narrative  ? Cares for her 59 yr old mother   ? [name] Husband walks with a cane and does have a history of falls   ? ?Social Determinants of Health  ? ?Financial Resource Strain: Low Risk   ? Difficulty of Paying Living Expenses: Not hard at all  ?Food Insecurity: No Food Insecurity  ? Worried About Charity fundraiser in the Last Year: Never true  ? Ran Out of Food in the Last Year: Never true  ?Transportation Needs: No Transportation Needs  ? Lack of Transportation (Medical): No  ? Lack of Transportation (Non-Medical): No  ?Physical Activity: Not on file  ?Stress: No Stress Concern Present  ? Feeling of Stress : Only a little  ?Social Connections: Socially Integrated  ? Frequency of Communication with Friends and Family: More than three times a week  ? Frequency of Social Gatherings with Friends and Family: More than three times a week  ? Attends Religious Services: 1 to 4 times per year  ? Active Member of Clubs or Organizations: Yes  ? Attends Archivist Meetings: 1 to 4 times per year  ? Marital Status: Married  ?Intimate Partner Violence: Not At Risk  ? Fear of Current or Ex-Partner:  No  ? Emotionally Abused: No  ? Physically Abused: No  ? Sexually Abused: No  ? ? ?Family History: No family history on file. ? ?Medications:   ?Current Outpatient Medications on File Prior to Visit  ?Medication Sig Dispense Refill  ? clopidogrel (PLAVIX) 75 MG tablet Take 1 tablet (75 mg total) by mouth daily. Will need refills from PCP 30 tablet 0  ? LORazepam (ATIVAN) 0.5 MG tablet Take by mouth.    ? losartan-hydrochlorothiazide (HYZAAR) 100-25 MG tablet Take 1 tablet by mouth daily.    ? ?No current facility-administered medications on file prior to visit.  ? ? ?Allergies:   ?Allergies  ?Allergen Reactions  ? Morphine And Related Nausea Only and Other (See Comments)  ?  EXTREME NAUSEA- "I could not raise my head off of the pillow"  ? Other Nausea Only and Other (  See Comments)  ?  NO SPICY FOODS- Extreme REFLUX!!  ? Codeine Rash  ? Penicillin G Rash  ? ? ? ? ?OBJECTIVE: ? ?Physical Exam ? ?Vitals:  ? 06/14/21 1529  ?BP: 132/70  ?Pulse: 81  ?SpO2: 97%  ?Weight: 179 lb (81.2 kg)  ?Height: 5' 3.5" (1.613 m)  ? ?Body mass index is 31.21 kg/m?Marland Kitchen ?No results found. ? ? ?General: well developed, well nourished, very pleasant elderly Caucasian female, seated, in no evident distress ?Head: head normocephalic and atraumatic.   ?Neck: supple with no carotid or supraclavicular bruits ?Cardiovascular: regular rate and rhythm, no murmurs ?Musculoskeletal: no deformity; some inner ankle tenderness bilaterally to palpation ?Skin:  no rash/petichiae; slightly asymmetry left ankle compared to right; no redness, warmth or skin color changes ?Vascular:  Normal pulses all extremities including pedal pulses bilaterally ?  ?Neurologic Exam ?Mental Status: Awake and fully alert.  Fluent speech and language.  Oriented to place and time. Recent and remote memory intact. Attention span, concentration and fund of knowledge appropriate. Mood and affect appropriate.  ?Cranial Nerves: Pupils equal, briskly reactive to light. Extraocular  movements full without nystagmus. Visual fields full to confrontation. Hearing intact. Facial sensation intact. Face, tongue, palate moves normally and symmetrically.  ?Motor: Normal bulk and tone. Constance Holster

## 2022-01-30 ENCOUNTER — Telehealth: Payer: Self-pay | Admitting: Hematology and Oncology

## 2022-01-30 NOTE — Telephone Encounter (Signed)
Scheduled appointment per 11/06 referral. Patient is aware of appointment date and time. Patient is aware to arrive 15 mins prior to appointment time and to bring updated insurance cards. Patient is aware of location.

## 2022-02-20 ENCOUNTER — Inpatient Hospital Stay: Payer: Medicare Other

## 2022-02-20 ENCOUNTER — Inpatient Hospital Stay: Payer: Medicare Other | Attending: Hematology and Oncology | Admitting: Hematology and Oncology

## 2022-02-20 DIAGNOSIS — D751 Secondary polycythemia: Secondary | ICD-10-CM | POA: Diagnosis not present

## 2022-02-20 DIAGNOSIS — N183 Chronic kidney disease, stage 3 unspecified: Secondary | ICD-10-CM | POA: Diagnosis not present

## 2022-02-20 DIAGNOSIS — Z8673 Personal history of transient ischemic attack (TIA), and cerebral infarction without residual deficits: Secondary | ICD-10-CM | POA: Diagnosis not present

## 2022-02-20 DIAGNOSIS — R748 Abnormal levels of other serum enzymes: Secondary | ICD-10-CM | POA: Diagnosis not present

## 2022-02-20 DIAGNOSIS — I129 Hypertensive chronic kidney disease with stage 1 through stage 4 chronic kidney disease, or unspecified chronic kidney disease: Secondary | ICD-10-CM | POA: Diagnosis not present

## 2022-02-20 DIAGNOSIS — Z87891 Personal history of nicotine dependence: Secondary | ICD-10-CM

## 2022-02-20 DIAGNOSIS — F17200 Nicotine dependence, unspecified, uncomplicated: Secondary | ICD-10-CM

## 2022-02-20 LAB — CBC WITH DIFFERENTIAL (CANCER CENTER ONLY)
Abs Immature Granulocytes: 0.01 10*3/uL (ref 0.00–0.07)
Basophils Absolute: 0.1 10*3/uL (ref 0.0–0.1)
Basophils Relative: 1 %
Eosinophils Absolute: 0.3 10*3/uL (ref 0.0–0.5)
Eosinophils Relative: 5 %
HCT: 46 % (ref 36.0–46.0)
Hemoglobin: 16.5 g/dL — ABNORMAL HIGH (ref 12.0–15.0)
Immature Granulocytes: 0 %
Lymphocytes Relative: 23 %
Lymphs Abs: 1.5 10*3/uL (ref 0.7–4.0)
MCH: 30.5 pg (ref 26.0–34.0)
MCHC: 35.9 g/dL (ref 30.0–36.0)
MCV: 85 fL (ref 80.0–100.0)
Monocytes Absolute: 0.6 10*3/uL (ref 0.1–1.0)
Monocytes Relative: 10 %
Neutro Abs: 3.8 10*3/uL (ref 1.7–7.7)
Neutrophils Relative %: 61 %
Platelet Count: 166 10*3/uL (ref 150–400)
RBC: 5.41 MIL/uL — ABNORMAL HIGH (ref 3.87–5.11)
RDW: 13.3 % (ref 11.5–15.5)
WBC Count: 6.3 10*3/uL (ref 4.0–10.5)
nRBC: 0 % (ref 0.0–0.2)

## 2022-02-20 LAB — CMP (CANCER CENTER ONLY)
ALT: 94 U/L — ABNORMAL HIGH (ref 0–44)
AST: 58 U/L — ABNORMAL HIGH (ref 15–41)
Albumin: 4.4 g/dL (ref 3.5–5.0)
Alkaline Phosphatase: 69 U/L (ref 38–126)
Anion gap: 7 (ref 5–15)
BUN: 48 mg/dL — ABNORMAL HIGH (ref 8–23)
CO2: 28 mmol/L (ref 22–32)
Calcium: 10.9 mg/dL — ABNORMAL HIGH (ref 8.9–10.3)
Chloride: 103 mmol/L (ref 98–111)
Creatinine: 1.26 mg/dL — ABNORMAL HIGH (ref 0.44–1.00)
GFR, Estimated: 45 mL/min — ABNORMAL LOW (ref >60)
Glucose, Bld: 131 mg/dL — ABNORMAL HIGH (ref 70–99)
Potassium: 3.6 mmol/L (ref 3.5–5.1)
Sodium: 138 mmol/L (ref 135–145)
Total Bilirubin: 0.7 mg/dL (ref 0.3–1.2)
Total Protein: 6.9 g/dL (ref 6.5–8.1)

## 2022-02-20 LAB — FERRITIN: Ferritin: 621 ng/mL — ABNORMAL HIGH (ref 11–307)

## 2022-02-21 ENCOUNTER — Encounter: Payer: Self-pay | Admitting: Hematology and Oncology

## 2022-02-21 DIAGNOSIS — R748 Abnormal levels of other serum enzymes: Secondary | ICD-10-CM | POA: Insufficient documentation

## 2022-02-21 LAB — ERYTHROPOIETIN: Erythropoietin: 7.3 m[IU]/mL (ref 2.6–18.5)

## 2022-02-21 NOTE — Progress Notes (Signed)
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 due to history of smoking and possible lung disease She has never had screening CT imaging done despite 50 pack years plus history of smoking I recommend CT imaging to look at the lung parenchyma for possible emphysema/interstitial lung disease as well as screening for lung cancer Her secondary polycythemia could also be a contributing factor to recurrent thrombosis/stroke She will continue Plavix for now  Elevated liver enzymes This is multifactorial, could be due to fatty liver disease However, iron overload could contribute to worsening liver disease As above, we will arrange for phlebotomy in her next visit  Orders Placed This Encounter  Procedures   CT CHEST LUNG CA SCREEN LOW DOSE W/O CM    Standing Status:   Future    Standing Expiration Date:   02/21/2023    Order Specific Question:   Preferred Imaging Location?    Answer:   Pharmacist, hospital [place]   CMP ([place] only)    Standing Status:   Future    Number of Occurrences:   1    Standing Expiration Date:   02/21/2023   CBC with Differential (Cancer Center Only)    Standing Status:   Future    Number of Occurrences:   1    Standing Expiration Date:   02/21/2023   Ferritin    Standing Status:   Future    Number of Occurrences:   1    Standing Expiration Date:   02/20/2023   Hemochromatosis DNA, PCR    Standing Status:    Future    Number of Occurrences:   1    Standing Expiration Date:   02/20/2023   Erythropoietin    Standing Status:   Future    Number of Occurrences:   1    Standing Expiration Date:   02/20/2023   Jak 2 V617F ([facility])    Standing Status:   Future    Number of Occurrences:   1    Standing Expiration Date:   02/21/2023     The total time spent in the appointment was 60 minutes encounter with patients including review of chart and various tests results, discussions about plan of care and coordination of care plan   All questions were answered. The patient knows to call the clinic with any problems, questions or concerns. No barriers to learning was detected.  Heath Lark, MD  CHIEF COMPLAINTS/PURPOSE OF CONSULTATION:  Erythrocytosis, abnormal iron studies  HISTORY OF PRESENTING ILLNESS:  Monica Rivers 76 y.o. female is here because of elevated hemoglobin as well as abnormal iron studies The patient is here by herself She had a stroke approximately a year and a half ago and at the time of evaluation/admission, she was noted to have high hemoglobin The patient had 50+ pack year of smoking, only quit since the stroke diagnosis Since her history of stroke, she has residual dizziness, visual difficulties and balance problems.  She denies recent  falls She had extensive evaluation by her primary care doctor recently and was noted to have several abnormal blood work including persistent polycythemia as well as abnormal iron studies.  She was also noted to have abnormal liver function and had ultrasound evaluation.  Iron studies was noted to be grossly abnormal  She denies intermittent headaches, shortness of breath on exertion, frequent leg cramps or chest pain.   There is no prior diagnosis of obstructive sleep apnea. The patient denies weight loss or skin itching.   MEDICAL HISTORY:  Past Medical History:  Diagnosis Date   Acute ischemic stroke ([place]) 07/19/2020   Anxiety 08/05/2015    CKD (chronic kidney disease) stage 3, GFR 30-59 ml/min (HCC) 08/14/2016   GERD (gastroesophageal reflux disease) 01/31/2016   Hypertension 08/05/2015   Pre-diabetes 08/05/2015   Vitamin D insufficiency 08/28/2016    SURGICAL HISTORY: No past surgical history on file.  SOCIAL HISTORY: Social History   Socioeconomic History   Marital status: Married    Spouse name: [name]   Number of children: Not on file   Years of education: hihg scool    Highest education level: Not on file  Occupational History    Comment: self employed- desk job   Tobacco Use   Smoking status: Former    Types: Cigarettes   Smokeless tobacco: Never  Substance and Sexual Activity   Alcohol use: Not Currently   Drug use: Never   Sexual activity: Yes    Partners: Male    Birth control/protection: None  Other Topics Concern   Not on file  Social History Narrative   Cares for her 82 yr old mother    [name] Husband walks with a cane and does have a history of falls    Social Determinants of Radio broadcast assistant Strain: Low Risk  (08/20/2020)   Overall Financial Resource Strain (CARDIA)    Difficulty of Paying Living Expenses: Not hard at all  Food Insecurity: No Food Insecurity (09/23/2020)   Hunger Vital Sign    Worried About Running Out of Food in the Last Year: Never true    [place] in the Last Year: Never true  Transportation Needs: No Transportation Needs (09/23/2020)   PRAPARE - Hydrologist (Medical): No    Lack of Transportation (Non-Medical): No  Physical Activity: Not on file  Stress: No Stress Concern Present (08/20/2020)   [place]    Feeling of Stress : Only a little  Social Connections: Socially Integrated (08/20/2020)   Social Connection and Isolation Panel [NHANES]    Frequency of Communication with Friends and Family: More than three times a week    Frequency of Social  Gatherings with Friends and Family: More than three times a week    Attends Religious Services: 1 to 4 times per year    Active Member of [employer] or Organizations: Yes    Attends Archivist Meetings: 1 to 4 times per year    Marital Status: Married  Human resources officer Violence: Not At Risk (09/23/2020)   Humiliation, Afraid, Rape, and Kick questionnaire    Fear of Current or Ex-Partner: No    Emotionally Abused: No    Physically Abused: No    Sexually Abused: No    FAMILY HISTORY: No family history on file.  ALLERGIES:  is allergic to morphine and related, other, codeine, and penicillin g.  MEDICATIONS:  Current Outpatient Medications  Medication Sig Dispense Refill   cholecalciferol (VITAMIN D3) 25 MCG (1000 UNIT) tablet Take 2,000 Units by mouth daily.     clopidogrel (PLAVIX) 75 MG tablet Take 1 tablet (75 mg total) by mouth daily. Will need refills from PCP 30 tablet 0   LORazepam (ATIVAN) 0.5 MG tablet Take by mouth.     losartan-hydrochlorothiazide (HYZAAR) 100-25 MG tablet Take 1 tablet by mouth daily.     pantoprazole (PROTONIX) 20 MG tablet Take 20 mg by mouth daily.     No current facility-administered medications for this visit.    REVIEW OF SYSTEMS:   Constitutional: Denies fevers, chills or abnormal night sweats Eyes: Denies blurriness of vision, double vision or watery eyes Ears, nose, mouth, throat, and face: Denies mucositis or sore throat Respiratory: Denies cough, dyspnea or wheezes Cardiovascular: Denies palpitation, chest discomfort or lower extremity swelling Gastrointestinal:  Denies nausea, heartburn or change in bowel habits Skin: Denies abnormal skin rashes Lymphatics: Denies new lymphadenopathy or easy bruising Neurological:Denies numbness, tingling or new weaknesses Behavioral/Psych: Mood is stable, no new changes  All other systems were reviewed with the patient and are negative.  PHYSICAL EXAMINATION: ECOG PERFORMANCE STATUS: 1 -  Symptomatic but completely ambulatory  Vitals:   02/20/22 1207  BP: (!) 159/76  Pulse: 94  Resp: 18  Temp: 97.8 F (36.6 C)  SpO2: 98%   Filed Weights   02/20/22 1207  Weight: 171 lb 12.8 oz (77.9 kg)    GENERAL:alert, no distress and comfortable SKIN: skin color, texture, turgor are normal, no rashes or significant lesions EYES: normal, conjunctiva are pink and non-injected, sclera clear OROPHARYNX:no exudate, no erythema and lips, buccal mucosa, and tongue normal  NECK: supple, thyroid normal size, non-tender, without nodularity LYMPH:  no palpable lymphadenopathy in the cervical, axillary or inguinal LUNGS: clear to auscultation and percussion with normal breathing effort HEART: regular rate & rhythm and no murmurs and no lower extremity edema ABDOMEN:abdomen soft, non-tender and normal bowel sounds Musculoskeletal:no cyanosis of digits and no clubbing  PSYCH: alert & oriented x 3 with fluent speech NEURO: no focal motor/sensory deficits  LABORATORY DATA:  I have reviewed the data as listed Recent Results (from the past 2160 hour(s))  Ferritin     Status: Abnormal   Collection Time: 02/20/22 12:47 PM  Result Value Ref Range   Ferritin 621 (H) 11 - 307 ng/mL    Comment: Performed at [facility], [place], [place], [place] [address]  CBC with Differential ([facility] Only)     Status: Abnormal   Collection Time: 02/20/22 12:47 PM  Result Value Ref Range   WBC Count 6.3 4.0 - 10.5 K/uL   RBC 5.41 (H) 3.87 - 5.11 MIL/uL   Hemoglobin 16.5 (H) 12.0 - 15.0 g/dL   HCT 46.0 36.0 - 46.0 %   MCV 85.0 80.0 - 100.0 fL   MCH 30.5 26.0 - 34.0 pg   MCHC 35.9 30.0 - 36.0 g/dL   RDW 13.3 11.5 - 15.5 %   Platelet Count 166 150 - 400 K/uL   nRBC 0.0 0.0 - 0.2 %   Neutrophils Relative % 61 %   Neutro Abs 3.8 1.7 - 7.7 K/uL   Lymphocytes Relative 23 %   Lymphs Abs 1.5 0.7 - 4.0 K/uL   Monocytes Relative 10 %   Monocytes Absolute 0.6 0.1 - 1.0 K/uL    Eosinophils Relative 5 %   Eosinophils Absolute 0.3 0.0 - 0.5 K/uL   Basophils Relative  1 %   Basophils Absolute 0.1 0.0 - 0.1 K/uL   Immature Granulocytes 0 %   Abs Immature Granulocytes 0.01 0.00 - 0.07 K/uL    Comment: Performed at [facility], [place] [address], [place], [place] [address]  CMP ([facility] only)     Status: Abnormal   Collection Time: 02/20/22 12:47 PM  Result Value Ref Range   Sodium 138 135 - 145 mmol/L   Potassium 3.6 3.5 - 5.1 mmol/L   Chloride 103 98 - 111 mmol/L   CO2 28 22 - 32 mmol/L   Glucose, Bld 131 (H) 70 - 99 mg/dL    Comment: Glucose reference range applies only to samples taken after fasting for at least 8 hours.   BUN 48 (H) 8 - 23 mg/dL   Creatinine 1.26 (H) 0.44 - 1.00 mg/dL   Calcium 10.9 (H) 8.9 - 10.3 mg/dL   Total Protein 6.9 6.5 - 8.1 g/dL   Albumin 4.4 3.5 - 5.0 g/dL   AST 58 (H) 15 - 41 U/L   ALT 94 (H) 0 - 44 U/L   Alkaline Phosphatase 69 38 - 126 U/L   Total Bilirubin 0.7 0.3 - 1.2 mg/dL   GFR, Estimated 45 (L) >60 mL/min    Comment: (NOTE) Calculated using the CKD-EPI Creatinine Equation (2021)    Anion gap 7 5 - 15    Comment: Performed at [facility], [place] [address] [place],  [address]    RADIOGRAPHIC STUDIES: I have reviewed outside records related to her liver imaging I have personally reviewed the radiological images as listed and agreed with the findings in the report.

## 2022-02-21 NOTE — Assessment & Plan Note (Signed)
This is multifactorial, could be due to fatty liver disease However, iron overload could contribute to worsening liver disease As above, we will arrange for phlebotomy in her next visit

## 2022-02-21 NOTE — Assessment & Plan Note (Addendum)
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

## 2022-02-21 NOTE — Assessment & Plan Note (Signed)
She has evidence of iron overload She has abnormal liver function tests as well as abnormal liver ultrasound I will order additional work-up to rule out inheritable hemochromatosis We discussed the role of phlebotomy Due to the timing of the procedure, she would like to defer until her next visit which I think is reasonable

## 2022-02-24 LAB — HEMOCHROMATOSIS DNA-PCR(C282Y,H63D)

## 2022-02-27 ENCOUNTER — Encounter: Payer: Self-pay | Admitting: Hematology and Oncology

## 2022-02-27 DIAGNOSIS — D471 Chronic myeloproliferative disease: Secondary | ICD-10-CM | POA: Insufficient documentation

## 2022-02-27 LAB — JAK 2 V617F (GENPATH)

## 2022-02-28 ENCOUNTER — Ambulatory Visit (HOSPITAL_BASED_OUTPATIENT_CLINIC_OR_DEPARTMENT_OTHER)
Admission: RE | Admit: 2022-02-28 | Discharge: 2022-02-28 | Disposition: A | Discharge status: Home / Self Care | Payer: Medicare Other | Source: Ambulatory Visit | Attending: Hematology and Oncology | Admitting: Hematology and Oncology

## 2022-02-28 DIAGNOSIS — Z122 Encounter for screening for malignant neoplasm of respiratory organs: Secondary | ICD-10-CM | POA: Insufficient documentation

## 2022-02-28 DIAGNOSIS — D751 Secondary polycythemia: Secondary | ICD-10-CM | POA: Insufficient documentation

## 2022-02-28 DIAGNOSIS — Z87891 Personal history of nicotine dependence: Secondary | ICD-10-CM | POA: Insufficient documentation

## 2022-02-28 DIAGNOSIS — F17200 Nicotine dependence, unspecified, uncomplicated: Secondary | ICD-10-CM | POA: Diagnosis present

## 2022-03-03 ENCOUNTER — Inpatient Hospital Stay: Payer: Medicare Other | Admitting: Hematology and Oncology

## 2022-03-03 ENCOUNTER — Other Ambulatory Visit: Payer: Self-pay | Admitting: Hematology and Oncology

## 2022-03-03 ENCOUNTER — Inpatient Hospital Stay: Payer: Medicare Other | Attending: Hematology and Oncology

## 2022-03-03 DIAGNOSIS — D751 Secondary polycythemia: Secondary | ICD-10-CM

## 2022-03-30 ENCOUNTER — Inpatient Hospital Stay: Payer: Medicare Other | Attending: Hematology and Oncology | Admitting: Hematology and Oncology

## 2022-03-30 ENCOUNTER — Other Ambulatory Visit: Payer: Self-pay

## 2022-03-30 ENCOUNTER — Inpatient Hospital Stay: Payer: Medicare Other

## 2022-03-30 ENCOUNTER — Encounter: Payer: Self-pay | Admitting: Hematology and Oncology

## 2022-03-30 DIAGNOSIS — Z122 Encounter for screening for malignant neoplasm of respiratory organs: Secondary | ICD-10-CM

## 2022-03-30 DIAGNOSIS — R748 Abnormal levels of other serum enzymes: Secondary | ICD-10-CM | POA: Insufficient documentation

## 2022-03-30 DIAGNOSIS — Z87891 Personal history of nicotine dependence: Secondary | ICD-10-CM | POA: Diagnosis not present

## 2022-03-30 DIAGNOSIS — Z8673 Personal history of transient ischemic attack (TIA), and cerebral infarction without residual deficits: Secondary | ICD-10-CM | POA: Insufficient documentation

## 2022-03-30 DIAGNOSIS — D471 Chronic myeloproliferative disease: Secondary | ICD-10-CM | POA: Insufficient documentation

## 2022-03-30 DIAGNOSIS — D751 Secondary polycythemia: Secondary | ICD-10-CM

## 2022-03-30 DIAGNOSIS — F17200 Nicotine dependence, unspecified, uncomplicated: Secondary | ICD-10-CM

## 2022-03-30 LAB — CBC WITH DIFFERENTIAL/PLATELET
Abs Immature Granulocytes: 0.02 10*3/uL (ref 0.00–0.07)
Basophils Absolute: 0.1 10*3/uL (ref 0.0–0.1)
Basophils Relative: 1 %
Eosinophils Absolute: 0.3 10*3/uL (ref 0.0–0.5)
Eosinophils Relative: 5 %
HCT: 45.3 % (ref 36.0–46.0)
Hemoglobin: 16 g/dL — ABNORMAL HIGH (ref 12.0–15.0)
Immature Granulocytes: 0 %
Lymphocytes Relative: 26 %
Lymphs Abs: 1.6 10*3/uL (ref 0.7–4.0)
MCH: 30.9 pg (ref 26.0–34.0)
MCHC: 35.3 g/dL (ref 30.0–36.0)
MCV: 87.5 fL (ref 80.0–100.0)
Monocytes Absolute: 0.7 10*3/uL (ref 0.1–1.0)
Monocytes Relative: 11 %
Neutro Abs: 3.4 10*3/uL (ref 1.7–7.7)
Neutrophils Relative %: 57 %
Platelets: 171 10*3/uL (ref 150–400)
RBC: 5.18 MIL/uL — ABNORMAL HIGH (ref 3.87–5.11)
RDW: 13.3 % (ref 11.5–15.5)
WBC: 6 10*3/uL (ref 4.0–10.5)
nRBC: 0 % (ref 0.0–0.2)

## 2022-03-30 NOTE — Assessment & Plan Note (Addendum)
We discussed role of bone marrow biopsy to investigate further, due to abnormal NGS After much discussion, she declined bone marrow biopsy and we agree for close observation Her hemoglobin has improved; I do not recommend starting her on Hydrea

## 2022-03-30 NOTE — Progress Notes (Signed)
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[place]) We discussed role of bone marrow biopsy to investigate further, due to abnormal NGS After much discussion, she declined bone marrow biopsy and we agree for close observation Her hemoglobin has improved; I do not recommend starting her on Hydrea  Iron overload She has evidence of iron overload She has abnormal liver function tests as well as abnormal liver ultrasound Genetic test ruled out inheritable hemochromatosis We discussed the role of phlebotomy and she agreed I will bring her back again in 3 months  Elevated liver enzymes This is multifactorial, could be due to fatty liver disease and iron overload  As above, we will arrange for phlebotomy again in her next visit  Encounter for screening for malignant neoplasm of lung in current smoker with 30 pack year history or greater CT screening for lung cancer is negative  Orders Placed This Encounter  Procedures   CMP ([facility] only)    Standing Status:   Standing    Number of Occurrences:   3    Standing Expiration Date:   03/31/2023   Ferritin    Standing Status:   Standing    Number of Occurrences:   3    Standing Expiration Date:   03/31/2023    The total time spent in the appointment was 30 minutes encounter with patients including review of chart and various tests results, discussions about plan of care and coordination of care plan   All questions were answered. The patient knows to call the clinic with any problems, questions or concerns. No barriers to learning was detected.    Monica Lark, MD 1/4/20244:30 PM  INTERVAL HISTORY: Monica Rivers 77 y.o. female returns for review of test results She has no symptoms We spent majority of our time reviewing multiple test results  SUMMARY OF HEMATOLOGIC HISTORY: he had a stroke approximately a year and a half ago and at the time of  evaluation/admission, she was noted to have high hemoglobin The patient had 50+ pack year of smoking, only quit since the stroke diagnosis Since her history of stroke, she has residual dizziness, visual difficulties and balance problems.  She denies recent falls She had extensive evaluation by her primary care doctor recently and was noted to have several abnormal blood work including persistent polycythemia as well as abnormal iron studies.  She was also noted to have abnormal liver function and had ultrasound evaluation.  Iron studies was noted to be grossly abnormal  She denies intermittent headaches, shortness of breath on exertion, frequent leg cramps or chest pain.   There is no prior diagnosis of obstructive sleep apnea. The patient denies weight loss or skin itching. 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  ALLERGIES:  is allergic to morphine and related, other, codeine, and penicillin g.  MEDICATIONS:  Current Outpatient Medications  Medication Sig Dispense Refill   cholecalciferol (VITAMIN D3) 25 MCG (1000 UNIT) tablet Take 2,000 Units by mouth daily.     clopidogrel (PLAVIX) 75 MG tablet Take 1 tablet (75 mg total) by mouth daily. Will need refills from PCP  30 tablet 0   LORazepam (ATIVAN) 0.5 MG tablet Take by mouth.     losartan-hydrochlorothiazide (HYZAAR) 100-25 MG tablet Take 1 tablet by mouth daily.     pantoprazole (PROTONIX) 20 MG tablet Take 20 mg by mouth daily.     No current facility-administered medications for this visit.     REVIEW OF SYSTEMS:   Constitutional: Denies fevers, chills or night sweats Eyes: Denies blurriness of vision Ears, nose,  mouth, throat, and face: Denies mucositis or sore throat Respiratory: Denies cough, dyspnea or wheezes Cardiovascular: Denies palpitation, chest discomfort or lower extremity swelling Gastrointestinal:  Denies nausea, heartburn or change in bowel habits Skin: Denies abnormal skin rashes Lymphatics: Denies new lymphadenopathy or easy bruising Neurological:Denies numbness, tingling or new weaknesses Behavioral/Psych: Mood is stable, no new changes  All other systems were reviewed with the patient and are negative.  PHYSICAL EXAMINATION: ECOG PERFORMANCE STATUS: 0 - Asymptomatic  Vitals:   03/30/22 1220  BP: (!) 148/67  Pulse: 84  Resp: 18  Temp: 97.8 F (36.6 C)  SpO2: 98%   Filed Weights   03/30/22 1220  Weight: 176 lb 9.6 oz (80.1 kg)    GENERAL:alert, no distress and comfortable NEURO: alert & oriented x 3 with fluent speech, no focal motor/sensory deficits  LABORATORY DATA:  I have reviewed the data as listed     Component Value Date/Time   NA 138 02/20/2022 1247   K 3.6 02/20/2022 1247   CL 103 02/20/2022 1247   CO2 28 02/20/2022 1247   GLUCOSE 131 (H) 02/20/2022 1247   BUN 48 (H) 02/20/2022 1247   CREATININE 1.26 (H) 02/20/2022 1247   CALCIUM 10.9 (H) 02/20/2022 1247   PROT 6.9 02/20/2022 1247   ALBUMIN 4.4 02/20/2022 1247   AST 58 (H) 02/20/2022 1247   ALT 94 (H) 02/20/2022 1247   ALKPHOS 69 02/20/2022 1247   BILITOT 0.7 02/20/2022 1247   GFRNONAA 45 (L) 02/20/2022 1247    No results found for: "SPEP", "UPEP"  Lab Results  Component Value Date   WBC 6.0 03/30/2022   NEUTROABS 3.4 03/30/2022   HGB 16.0 (H) 03/30/2022   HCT 45.3 03/30/2022   MCV 87.5 03/30/2022   PLT 171 03/30/2022      Chemistry      Component Value Date/Time   NA 138 02/20/2022 1247   K 3.6 02/20/2022 1247   CL 103 02/20/2022 1247   CO2 28 02/20/2022 1247   BUN 48 (H) 02/20/2022 1247   CREATININE 1.26 (H) 02/20/2022 1247      Component Value Date/Time   CALCIUM 10.9 (H)  02/20/2022 1247   ALKPHOS 69 02/20/2022 1247   AST 58 (H) 02/20/2022 1247   ALT 94 (H) 02/20/2022 1247   BILITOT 0.7 02/20/2022 1247       RADIOGRAPHIC STUDIES: I have reviewed her CT I have personally reviewed the radiological images as listed and agreed with the findings in the report.

## 2022-03-30 NOTE — Assessment & Plan Note (Signed)
CT screening for lung cancer is negative

## 2022-03-30 NOTE — Assessment & Plan Note (Signed)
She has evidence of iron overload She has abnormal liver function tests as well as abnormal liver ultrasound Genetic test ruled out inheritable hemochromatosis We discussed the role of phlebotomy and she agreed I will bring her back again in 3 months

## 2022-03-30 NOTE — Assessment & Plan Note (Signed)
This is multifactorial, could be due to fatty liver disease and iron overload  As above, we will arrange for phlebotomy again in her next visit

## 2022-03-30 NOTE — Progress Notes (Signed)
Hemoglobin 16.0 today, so appropriate for ordered therapeutic phlebotomy. Patient seated and VSS- 16g used in the R [facility] with efficacy. Started at 1345 and ended at 79. 508g removed from patient. Patient offered food and fluids- stayed for the 30 minute observation.  Ambulated with her cane to the lobby independently.

## 2022-03-30 NOTE — Patient Instructions (Signed)

## 2022-05-22 ENCOUNTER — Encounter: Payer: Self-pay | Admitting: Hematology and Oncology

## 2022-06-27 ENCOUNTER — Inpatient Hospital Stay: Payer: Medicare Other

## 2022-06-27 ENCOUNTER — Other Ambulatory Visit: Payer: Self-pay

## 2022-06-27 ENCOUNTER — Inpatient Hospital Stay (HOSPITAL_BASED_OUTPATIENT_CLINIC_OR_DEPARTMENT_OTHER): Payer: Medicare Other | Admitting: Hematology and Oncology

## 2022-06-27 ENCOUNTER — Encounter: Payer: Self-pay | Admitting: Hematology and Oncology

## 2022-06-27 ENCOUNTER — Inpatient Hospital Stay: Payer: Medicare Other | Attending: Hematology and Oncology

## 2022-06-27 VITALS — BP 120/77 | HR 84 | Resp 17

## 2022-06-27 VITALS — BP 137/83 | HR 90 | Temp 98.0°F | Resp 18 | Ht 63.5 in | Wt 175.2 lb

## 2022-06-27 DIAGNOSIS — R7989 Other specified abnormal findings of blood chemistry: Secondary | ICD-10-CM | POA: Diagnosis not present

## 2022-06-27 DIAGNOSIS — D751 Secondary polycythemia: Secondary | ICD-10-CM | POA: Diagnosis present

## 2022-06-27 DIAGNOSIS — R7303 Prediabetes: Secondary | ICD-10-CM | POA: Diagnosis not present

## 2022-06-27 DIAGNOSIS — D471 Chronic myeloproliferative disease: Secondary | ICD-10-CM | POA: Diagnosis not present

## 2022-06-27 DIAGNOSIS — R748 Abnormal levels of other serum enzymes: Secondary | ICD-10-CM

## 2022-06-27 LAB — CBC WITH DIFFERENTIAL/PLATELET
Abs Immature Granulocytes: 0.01 10*3/uL (ref 0.00–0.07)
Basophils Absolute: 0.1 10*3/uL (ref 0.0–0.1)
Basophils Relative: 1 %
Eosinophils Absolute: 0.2 10*3/uL (ref 0.0–0.5)
Eosinophils Relative: 4 %
HCT: 49.4 % — ABNORMAL HIGH (ref 36.0–46.0)
Hemoglobin: 16.9 g/dL — ABNORMAL HIGH (ref 12.0–15.0)
Immature Granulocytes: 0 %
Lymphocytes Relative: 27 %
Lymphs Abs: 1.5 10*3/uL (ref 0.7–4.0)
MCH: 29.4 pg (ref 26.0–34.0)
MCHC: 34.2 g/dL (ref 30.0–36.0)
MCV: 85.9 fL (ref 80.0–100.0)
Monocytes Absolute: 0.7 10*3/uL (ref 0.1–1.0)
Monocytes Relative: 12 %
Neutro Abs: 3.2 10*3/uL (ref 1.7–7.7)
Neutrophils Relative %: 56 %
Platelets: 187 10*3/uL (ref 150–400)
RBC: 5.75 MIL/uL — ABNORMAL HIGH (ref 3.87–5.11)
RDW: 12.8 % (ref 11.5–15.5)
WBC: 5.7 10*3/uL (ref 4.0–10.5)
nRBC: 0 % (ref 0.0–0.2)

## 2022-06-27 LAB — CMP (CANCER CENTER ONLY)
ALT: 88 U/L — ABNORMAL HIGH (ref 0–44)
AST: 56 U/L — ABNORMAL HIGH (ref 15–41)
Albumin: 4.3 g/dL (ref 3.5–5.0)
Alkaline Phosphatase: 71 U/L (ref 38–126)
Anion gap: 8 (ref 5–15)
BUN: 45 mg/dL — ABNORMAL HIGH (ref 8–23)
CO2: 28 mmol/L (ref 22–32)
Calcium: 11.9 mg/dL — ABNORMAL HIGH (ref 8.9–10.3)
Chloride: 102 mmol/L (ref 98–111)
Creatinine: 1.24 mg/dL — ABNORMAL HIGH (ref 0.44–1.00)
GFR, Estimated: 45 mL/min — ABNORMAL LOW (ref >60)
Glucose, Bld: 131 mg/dL — ABNORMAL HIGH (ref 70–99)
Potassium: 3.8 mmol/L (ref 3.5–5.1)
Sodium: 138 mmol/L (ref 135–145)
Total Bilirubin: 0.6 mg/dL (ref 0.3–1.2)
Total Protein: 7 g/dL (ref 6.5–8.1)

## 2022-06-27 LAB — FERRITIN: Ferritin: 245 ng/mL (ref 11–307)

## 2022-06-27 NOTE — Assessment & Plan Note (Signed)
She has evidence of iron overload She has abnormal liver function tests as well as abnormal liver ultrasound Genetic test ruled out inheritable hemochromatosis We discussed the role of phlebotomy and she agreed I recommend changing the interval between phlebotomy to every 2 months

## 2022-06-27 NOTE — Progress Notes (Signed)
Monica Rivers presents today for phlebotomy per MD orders. Phlebotomy procedure started at 1342 and ended at 1347. 521 grams removed. Patient observed for 30 minutes after procedure without any incident. Food and drink provided IV needle removed intact.  Pt tolerated procedure well.  D/C to lobby in stable condition

## 2022-06-27 NOTE — Assessment & Plan Note (Signed)
Her hemoglobin has increased a bit but she is not symptomatic I suspect this is related to dehydration We will monitor closely and repeat next week We will proceed with phlebotomy

## 2022-06-27 NOTE — Assessment & Plan Note (Signed)
She is asymptomatic take hypercalcemia I suspect this is due to dehydration and her Hyzaar I recommend the patient to hold off her blood pressure medication for few days and I plan to see her back on Monday to recheck her calcium level along with PTH level We discussed importance of adequate hydration before her next blood draw

## 2022-06-27 NOTE — Progress Notes (Signed)
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[place]) Her hemoglobin has increased a bit but she is not symptomatic I suspect this is related to dehydration We will monitor closely and repeat next week We will proceed with phlebotomy  Iron overload She has evidence of iron overload She has abnormal liver function tests as well as abnormal liver ultrasound Genetic test ruled out inheritable hemochromatosis We discussed the role of phlebotomy and she agreed I recommend changing the interval between phlebotomy to every 2 months  Hypercalcemia She is asymptomatic take hypercalcemia I suspect this is due to dehydration and her Hyzaar I recommend the patient to hold off her blood pressure medication for few days and I plan to see her back on Monday to recheck her calcium level along with PTH level We discussed importance of adequate hydration before her next blood draw  Orders Placed This Encounter  Procedures   Hemoglobin A1c    Standing Status:   Future    Number of Occurrences:   1    Standing Expiration Date:   06/27/2023   PTH, intact and calcium    Standing Status:   Future    Standing Expiration Date:   06/27/2023   CBC with Differential ([employer] Only)    Standing Status:   Future    Standing Expiration Date:   06/27/2023    All questions were answered. The patient knows to call the clinic with any problems, questions or concerns. The total time spent in the appointment was 30 minutes encounter with patients including review of chart and various tests results, discussions about plan of care and coordination of care plan   Heath Lark, MD 06/27/2022 3:57 PM  INTERVAL HISTORY: Please see below for problem oriented charting. she returns for treatment follow-up for phlebotomy She was seen  twice Initially, she was seen with CBC results prior to phlebotomy Subsequently, her CMP come back abnormal and we discussed test results and management of hypercalcemia  REVIEW OF SYSTEMS:   Constitutional: Denies fevers, chills or abnormal weight loss Eyes: Denies blurriness of vision Ears, nose, mouth, throat, and face: Denies mucositis or sore throat Respiratory: Denies cough, dyspnea or wheezes Cardiovascular: Denies palpitation, chest discomfort or lower extremity swelling Gastrointestinal:  Denies nausea, heartburn or change in bowel habits Skin: Denies abnormal skin rashes Lymphatics: Denies new lymphadenopathy or easy bruising Neurological:Denies numbness, tingling or new weaknesses Behavioral/Psych: Mood is stable, no new changes  All other systems were reviewed with the patient and are negative.  I have reviewed the past medical history, past surgical history, social history and family history with the patient and they are unchanged from previous note.  ALLERGIES:  is allergic to morphine and related, other, codeine, and penicillin g.  MEDICATIONS:  Current Outpatient Medications  Medication Sig Dispense Refill   cholecalciferol (VITAMIN D3) 25 MCG (1000 UNIT) tablet Take 2,000 Units by mouth daily.     clopidogrel (PLAVIX) 75 MG tablet Take 1 tablet (75 mg total) by mouth daily. Will need refills from PCP 30 tablet 0   LORazepam (ATIVAN) 0.5 MG tablet Take by mouth.     losartan-hydrochlorothiazide (HYZAAR) 100-25 MG tablet Take 1 tablet by mouth daily.     pantoprazole (PROTONIX) 20 MG tablet Take 20 mg by mouth daily.     No current facility-administered medications for  this visit.    SUMMARY OF ONCOLOGIC HISTORY: Oncology History   No history exists.    PHYSICAL EXAMINATION: ECOG PERFORMANCE STATUS: 1 - Symptomatic but completely ambulatory  Vitals:   06/27/22 1305  BP: 137/83  Pulse: 90  Resp: 18  Temp: 98 F (36.7 C)  SpO2: 97%   Filed Weights    06/27/22 1305  Weight: 175 lb 3.2 oz (79.5 kg)    GENERAL:alert, no distress and comfortable  NEURO: alert & oriented x 3 with fluent speech, no focal motor/sensory deficits  LABORATORY DATA:  I have reviewed the data as listed    Component Value Date/Time   NA 138 06/27/2022 1224   K 3.8 06/27/2022 1224   CL 102 06/27/2022 1224   CO2 28 06/27/2022 1224   GLUCOSE 131 (H) 06/27/2022 1224   BUN 45 (H) 06/27/2022 1224   CREATININE 1.24 (H) 06/27/2022 1224   CALCIUM 11.9 (H) 06/27/2022 1224   PROT 7.0 06/27/2022 1224   ALBUMIN 4.3 06/27/2022 1224   AST 56 (H) 06/27/2022 1224   ALT 88 (H) 06/27/2022 1224   ALKPHOS 71 06/27/2022 1224   BILITOT 0.6 06/27/2022 1224   GFRNONAA 45 (L) 06/27/2022 1224    No results found for: "SPEP", "UPEP"  Lab Results  Component Value Date   WBC 5.7 06/27/2022   NEUTROABS 3.2 06/27/2022   HGB 16.9 (H) 06/27/2022   HCT 49.4 (H) 06/27/2022   MCV 85.9 06/27/2022   PLT 187 06/27/2022      Chemistry      Component Value Date/Time   NA 138 06/27/2022 1224   K 3.8 06/27/2022 1224   CL 102 06/27/2022 1224   CO2 28 06/27/2022 1224   BUN 45 (H) 06/27/2022 1224   CREATININE 1.24 (H) 06/27/2022 1224      Component Value Date/Time   CALCIUM 11.9 (H) 06/27/2022 1224   ALKPHOS 71 06/27/2022 1224   AST 56 (H) 06/27/2022 1224   ALT 88 (H) 06/27/2022 1224   BILITOT 0.6 06/27/2022 1224

## 2022-06-28 LAB — HEMOGLOBIN A1C
Hgb A1c MFr Bld: 7.1 % — ABNORMAL HIGH (ref 4.8–5.6)
Mean Plasma Glucose: 157 mg/dL

## 2022-07-03 ENCOUNTER — Inpatient Hospital Stay (HOSPITAL_BASED_OUTPATIENT_CLINIC_OR_DEPARTMENT_OTHER): Payer: Medicare Other | Admitting: Hematology and Oncology

## 2022-07-03 ENCOUNTER — Encounter: Payer: Self-pay | Admitting: Hematology and Oncology

## 2022-07-03 ENCOUNTER — Inpatient Hospital Stay: Payer: Medicare Other

## 2022-07-03 VITALS — BP 143/73 | HR 80 | Temp 97.9°F | Resp 18 | Ht 63.5 in | Wt 178.2 lb

## 2022-07-03 DIAGNOSIS — D751 Secondary polycythemia: Secondary | ICD-10-CM | POA: Diagnosis not present

## 2022-07-03 DIAGNOSIS — D471 Chronic myeloproliferative disease: Secondary | ICD-10-CM

## 2022-07-03 DIAGNOSIS — R7303 Prediabetes: Secondary | ICD-10-CM

## 2022-07-03 LAB — CBC WITH DIFFERENTIAL (CANCER CENTER ONLY)
Abs Immature Granulocytes: 0.02 10*3/uL (ref 0.00–0.07)
Basophils Absolute: 0.1 10*3/uL (ref 0.0–0.1)
Basophils Relative: 1 %
Eosinophils Absolute: 0.3 10*3/uL (ref 0.0–0.5)
Eosinophils Relative: 5 %
HCT: 40.1 % (ref 36.0–46.0)
Hemoglobin: 13.7 g/dL (ref 12.0–15.0)
Immature Granulocytes: 0 %
Lymphocytes Relative: 23 %
Lymphs Abs: 1.6 10*3/uL (ref 0.7–4.0)
MCH: 29.7 pg (ref 26.0–34.0)
MCHC: 34.2 g/dL (ref 30.0–36.0)
MCV: 86.8 fL (ref 80.0–100.0)
Monocytes Absolute: 0.6 10*3/uL (ref 0.1–1.0)
Monocytes Relative: 9 %
Neutro Abs: 4.2 10*3/uL (ref 1.7–7.7)
Neutrophils Relative %: 62 %
Platelet Count: 185 10*3/uL (ref 150–400)
RBC: 4.62 MIL/uL (ref 3.87–5.11)
RDW: 13.1 % (ref 11.5–15.5)
WBC Count: 6.9 10*3/uL (ref 4.0–10.5)
nRBC: 0 % (ref 0.0–0.2)

## 2022-07-03 NOTE — Assessment & Plan Note (Signed)
This is improved with recent phlebotomy I will see her next month as scheduled

## 2022-07-03 NOTE — Progress Notes (Signed)
[facility] OFFICE PROGRESS NOTE  Patient Care Team: Eather Colas, FNP as PCP - General (Nurse Practitioner) Ihor Austin, NP as Nurse Practitioner (Neurology)  ASSESSMENT & PLAN:  Polycythemia, secondary This is improved with recent phlebotomy I will see her next month as scheduled  Hypercalcemia She is able to hydrate better Repeat calcium level and PTH is pending She felt great since she omitted losartan-hydrochlorothiazide Her documented blood pressure has been normal She will continue to check her blood pressure closely We will call her with test results tomorrow  No orders of the defined types were placed in this encounter.   All questions were answered. The patient knows to call the clinic with any problems, questions or concerns. The total time spent in the appointment was 20 minutes encounter with patients including review of chart and various tests results, discussions about plan of care and coordination of care plan   Artis Delay, MD 07/03/2022 3:20 PM  INTERVAL HISTORY: Please see below for problem oriented charting. she returns for treatment follow-up She attempted to drink approximately 48 ounces per day.  When she drinks more water, she has occasional gastritis/heartburn Her blood pressure has been within normal range.  All her systolic blood pressure has been under 120 despite withholding blood pressure medications  REVIEW OF SYSTEMS:   Constitutional: Denies fevers, chills or abnormal weight loss Eyes: Denies blurriness of vision Ears, nose, mouth, throat, and face: Denies mucositis or sore throat Respiratory: Denies cough, dyspnea or wheezes Cardiovascular: Denies palpitation, chest discomfort or lower extremity swelling Skin: Denies abnormal skin rashes Lymphatics: Denies new lymphadenopathy or easy bruising Neurological:Denies numbness, tingling or new weaknesses Behavioral/Psych: Mood is stable, no new changes  All other systems were  reviewed with the patient and are negative.  I have reviewed the past medical history, past surgical history, social history and family history with the patient and they are unchanged from previous note.  ALLERGIES:  is allergic to morphine and related, other, codeine, and penicillin g.  MEDICATIONS:  Current Outpatient Medications  Medication Sig Dispense Refill   cholecalciferol (VITAMIN D3) 25 MCG (1000 UNIT) tablet Take 2,000 Units by mouth daily.     clopidogrel (PLAVIX) 75 MG tablet Take 1 tablet (75 mg total) by mouth daily. Will need refills from PCP 30 tablet 0   LORazepam (ATIVAN) 0.5 MG tablet Take by mouth.     losartan-hydrochlorothiazide (HYZAAR) 100-25 MG tablet Take 1 tablet by mouth daily.     pantoprazole (PROTONIX) 20 MG tablet Take 20 mg by mouth daily.     No current facility-administered medications for this visit.    SUMMARY OF ONCOLOGIC HISTORY:  he had a stroke approximately a year and a half ago and at the time of evaluation/admission, she was noted to have high hemoglobin The patient had 50+ pack year of smoking, only quit since the stroke diagnosis Since her history of stroke, she has residual dizziness, visual difficulties and balance problems.  She denies recent falls She had extensive evaluation by her primary care doctor recently and was noted to have several abnormal blood work including persistent polycythemia as well as abnormal iron studies.  She was also noted to have abnormal liver function and had ultrasound evaluation.  Iron studies was noted to be grossly abnormal  She denies intermittent headaches, shortness of breath on exertion, frequent leg cramps or chest pain.   There is no prior diagnosis of obstructive sleep apnea. The patient denies weight loss or skin itching.  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 Genetic test for hemochromatosis is negative CT chest screening for lung cancer  is neg in Dec 2023 The patient received intermittent phlebotomy in 2024  PHYSICAL EXAMINATION: ECOG PERFORMANCE STATUS: 0 - Asymptomatic  Vitals:   07/03/22 1507  BP: (!) 143/73  Pulse: 80  Resp: 18  Temp: 97.9 F (36.6 C)  SpO2: 99%   Filed Weights   07/03/22 1507  Weight: 178 lb 3.2 oz (80.8 kg)    GENERAL:alert, no distress and comfortable NEURO: alert & oriented x 3 with fluent speech, no focal motor/sensory deficits  LABORATORY DATA:  I have reviewed the data as listed    Component Value Date/Time   NA 138 06/27/2022 1224   K 3.8 06/27/2022 1224   CL 102 06/27/2022 1224   CO2 28 06/27/2022 1224   GLUCOSE 131 (H) 06/27/2022 1224   BUN 45 (H) 06/27/2022 1224   CREATININE 1.24 (H) 06/27/2022 1224   CALCIUM 11.9 (H) 06/27/2022 1224   PROT 7.0 06/27/2022 1224   ALBUMIN 4.3 06/27/2022 1224   AST 56 (H) 06/27/2022 1224   ALT 88 (H) 06/27/2022 1224   ALKPHOS 71 06/27/2022 1224   BILITOT 0.6 06/27/2022 1224   GFRNONAA 45 (L) 06/27/2022 1224    No results found for: "SPEP", "UPEP"  Lab Results  Component Value Date   WBC 6.9 07/03/2022   NEUTROABS 4.2 07/03/2022   HGB 13.7 07/03/2022   HCT 40.1 07/03/2022   MCV 86.8 07/03/2022   PLT 185 07/03/2022      Chemistry      Component Value Date/Time   NA 138 06/27/2022 1224   K 3.8 06/27/2022 1224   CL 102 06/27/2022 1224   CO2 28 06/27/2022 1224   BUN 45 (H) 06/27/2022 1224   CREATININE 1.24 (H) 06/27/2022 1224      Component Value Date/Time   CALCIUM 11.9 (H) 06/27/2022 1224   ALKPHOS 71 06/27/2022 1224   AST 56 (H) 06/27/2022 1224   ALT 88 (H) 06/27/2022 1224   BILITOT 0.6 06/27/2022 1224

## 2022-07-03 NOTE — Assessment & Plan Note (Signed)
She is able to hydrate better Repeat calcium level and PTH is pending She felt great since she omitted losartan-hydrochlorothiazide Her documented blood pressure has been normal She will continue to check her blood pressure closely We will call her with test results tomorrow

## 2022-07-04 ENCOUNTER — Telehealth: Payer: Self-pay

## 2022-07-04 LAB — PTH, INTACT AND CALCIUM
Calcium, Total (PTH): 9.1 mg/dL (ref 8.7–10.3)
PTH: 47 pg/mL (ref 15–65)

## 2022-07-04 NOTE — Telephone Encounter (Signed)
Called and given below message. She verbalized understanding and appreciated the call. 

## 2022-07-04 NOTE — Telephone Encounter (Signed)
-----   Message from Artis Delay, MD sent at 07/04/2022 12:39 PM EDT ----- Her calcium and PTH came back normal Please let her know all is good

## 2022-08-22 ENCOUNTER — Inpatient Hospital Stay: Payer: Medicare Other

## 2022-08-22 ENCOUNTER — Encounter: Payer: Self-pay | Admitting: Hematology and Oncology

## 2022-08-22 ENCOUNTER — Inpatient Hospital Stay (HOSPITAL_BASED_OUTPATIENT_CLINIC_OR_DEPARTMENT_OTHER): Payer: Medicare Other | Admitting: Hematology and Oncology

## 2022-08-22 ENCOUNTER — Inpatient Hospital Stay: Payer: Medicare Other | Attending: Hematology and Oncology

## 2022-08-22 VITALS — BP 124/68 | HR 73 | Temp 97.8°F | Resp 16

## 2022-08-22 DIAGNOSIS — R748 Abnormal levels of other serum enzymes: Secondary | ICD-10-CM

## 2022-08-22 DIAGNOSIS — D751 Secondary polycythemia: Secondary | ICD-10-CM

## 2022-08-22 DIAGNOSIS — D471 Chronic myeloproliferative disease: Secondary | ICD-10-CM | POA: Diagnosis present

## 2022-08-22 LAB — FERRITIN: Ferritin: 138 ng/mL (ref 11–307)

## 2022-08-22 LAB — CMP (CANCER CENTER ONLY)
ALT: 87 U/L — ABNORMAL HIGH (ref 0–44)
AST: 55 U/L — ABNORMAL HIGH (ref 15–41)
Albumin: 4.2 g/dL (ref 3.5–5.0)
Alkaline Phosphatase: 76 U/L (ref 38–126)
Anion gap: 5 (ref 5–15)
BUN: 35 mg/dL — ABNORMAL HIGH (ref 8–23)
CO2: 31 mmol/L (ref 22–32)
Calcium: 10.9 mg/dL — ABNORMAL HIGH (ref 8.9–10.3)
Chloride: 101 mmol/L (ref 98–111)
Creatinine: 1.17 mg/dL — ABNORMAL HIGH (ref 0.44–1.00)
GFR, Estimated: 49 mL/min — ABNORMAL LOW (ref >60)
Glucose, Bld: 148 mg/dL — ABNORMAL HIGH (ref 70–99)
Potassium: 3.9 mmol/L (ref 3.5–5.1)
Sodium: 137 mmol/L (ref 135–145)
Total Bilirubin: 0.5 mg/dL (ref 0.3–1.2)
Total Protein: 6.6 g/dL (ref 6.5–8.1)

## 2022-08-22 LAB — CBC WITH DIFFERENTIAL/PLATELET
Abs Immature Granulocytes: 0.01 10*3/uL (ref 0.00–0.07)
Basophils Absolute: 0.1 10*3/uL (ref 0.0–0.1)
Basophils Relative: 1 %
Eosinophils Absolute: 0.3 10*3/uL (ref 0.0–0.5)
Eosinophils Relative: 5 %
HCT: 45.6 % (ref 36.0–46.0)
Hemoglobin: 15.6 g/dL — ABNORMAL HIGH (ref 12.0–15.0)
Immature Granulocytes: 0 %
Lymphocytes Relative: 28 %
Lymphs Abs: 1.4 10*3/uL (ref 0.7–4.0)
MCH: 28.9 pg (ref 26.0–34.0)
MCHC: 34.2 g/dL (ref 30.0–36.0)
MCV: 84.6 fL (ref 80.0–100.0)
Monocytes Absolute: 0.5 10*3/uL (ref 0.1–1.0)
Monocytes Relative: 10 %
Neutro Abs: 2.8 10*3/uL (ref 1.7–7.7)
Neutrophils Relative %: 56 %
Platelets: 176 10*3/uL (ref 150–400)
RBC: 5.39 MIL/uL — ABNORMAL HIGH (ref 3.87–5.11)
RDW: 13.1 % (ref 11.5–15.5)
WBC: 5 10*3/uL (ref 4.0–10.5)
nRBC: 0 % (ref 0.0–0.2)

## 2022-08-22 NOTE — Assessment & Plan Note (Signed)
Her hemoglobin has increased a bit but she is not symptomatic We will proceed with phlebotomy

## 2022-08-22 NOTE — Patient Instructions (Signed)

## 2022-08-22 NOTE — Progress Notes (Signed)
Monica Rivers presents today for phlebotomy per MD orders. Phlebotomy kit used with 16 gauge needle. Phlebotomy procedure started at 1310 and ended at 1317 527 grams removed. Patient observed for 30 minutes after procedure without any incident. Patient tolerated procedure well. IV needle removed intact. Food and Fluids offered, fluids taken. Pt. declines to stay for full 30 minute post observation. Vital signs stable, left via ambulation and no respiratory distress noted.

## 2022-08-22 NOTE — Assessment & Plan Note (Signed)
She has evidence of iron overload Genetic test ruled out inheritable hemochromatosis We discussed the role of phlebotomy and she agreed I recommend changing the interval between phlebotomy to every 2 months

## 2022-08-22 NOTE — Progress Notes (Signed)
[facility] OFFICE PROGRESS NOTE  Patient Care Team: Eather Colas, FNP as PCP - General (Nurse Practitioner) Ihor Austin, NP as Nurse Practitioner (Neurology)  ASSESSMENT & PLAN:  MPN (myeloproliferative neoplasm) ([facility]) Her hemoglobin has increased a bit but she is not symptomatic We will proceed with phlebotomy  Iron overload She has evidence of iron overload Genetic test ruled out inheritable hemochromatosis We discussed the role of phlebotomy and she agreed I recommend changing the interval between phlebotomy to every 2 months  No orders of the defined types were placed in this encounter.   All questions were answered. The patient knows to call the clinic with any problems, questions or concerns. The total time spent in the appointment was 20 minutes encounter with patients including review of chart and various tests results, discussions about plan of care and coordination of care plan   Artis Delay, MD 08/22/2022 12:13 PM  INTERVAL HISTORY: Please see below for problem oriented charting. she returns for treatment follow-up She felt fine Denies recent dizziness Her blood pressure control has improved since she had received regular phlebotomy We discussed test results and timing of her next treatment  REVIEW OF SYSTEMS:   Constitutional: Denies fevers, chills or abnormal weight loss Eyes: Denies blurriness of vision Ears, nose, mouth, throat, and face: Denies mucositis or sore throat Respiratory: Denies cough, dyspnea or wheezes Cardiovascular: Denies palpitation, chest discomfort or lower extremity swelling Gastrointestinal:  Denies nausea, heartburn or change in bowel habits Skin: Denies abnormal skin rashes Lymphatics: Denies new lymphadenopathy or easy bruising Neurological:Denies numbness, tingling or new weaknesses Behavioral/Psych: Mood is stable, no new changes  All other systems were reviewed with the patient and are negative.  I have  reviewed the past medical history, past surgical history, social history and family history with the patient and they are unchanged from previous note.  ALLERGIES:  is allergic to morphine and codeine, other, codeine, and penicillin g.  MEDICATIONS:  Current Outpatient Medications  Medication Sig Dispense Refill   cholecalciferol (VITAMIN D3) 25 MCG (1000 UNIT) tablet Take 2,000 Units by mouth daily.     clopidogrel (PLAVIX) 75 MG tablet Take 1 tablet (75 mg total) by mouth daily. Will need refills from PCP 30 tablet 0   LORazepam (ATIVAN) 0.5 MG tablet Take by mouth.     losartan-hydrochlorothiazide (HYZAAR) 100-25 MG tablet Take 1 tablet by mouth daily.     pantoprazole (PROTONIX) 20 MG tablet Take 20 mg by mouth daily.     No current facility-administered medications for this visit.    SUMMARY OF ONCOLOGIC HISTORY: Oncology History   No history exists.    PHYSICAL EXAMINATION: ECOG PERFORMANCE STATUS: 0 - Asymptomatic  GENERAL:alert, no distress and comfortable NEURO: alert & oriented x 3 with fluent speech, no focal motor/sensory deficits  LABORATORY DATA:  I have reviewed the data as listed    Component Value Date/Time   NA 138 06/27/2022 1224   K 3.8 06/27/2022 1224   CL 102 06/27/2022 1224   CO2 28 06/27/2022 1224   GLUCOSE 131 (H) 06/27/2022 1224   BUN 45 (H) 06/27/2022 1224   CREATININE 1.24 (H) 06/27/2022 1224   CALCIUM 9.1 07/03/2022 1422   PROT 7.0 06/27/2022 1224   ALBUMIN 4.3 06/27/2022 1224   AST 56 (H) 06/27/2022 1224   ALT 88 (H) 06/27/2022 1224   ALKPHOS 71 06/27/2022 1224   BILITOT 0.6 06/27/2022 1224   GFRNONAA 45 (L) 06/27/2022 1224    No results  found for: "SPEP", "UPEP"  Lab Results  Component Value Date   WBC 5.0 08/22/2022   NEUTROABS 2.8 08/22/2022   HGB 15.6 (H) 08/22/2022   HCT 45.6 08/22/2022   MCV 84.6 08/22/2022   PLT 176 08/22/2022      Chemistry      Component Value Date/Time   NA 138 06/27/2022 1224   K 3.8 06/27/2022  1224   CL 102 06/27/2022 1224   CO2 28 06/27/2022 1224   BUN 45 (H) 06/27/2022 1224   CREATININE 1.24 (H) 06/27/2022 1224      Component Value Date/Time   CALCIUM 9.1 07/03/2022 1422   ALKPHOS 71 06/27/2022 1224   AST 56 (H) 06/27/2022 1224   ALT 88 (H) 06/27/2022 1224   BILITOT 0.6 06/27/2022 1224

## 2022-09-18 IMAGING — CT CT HEAD W/O CM
3 series · 15 of 47 positions shown, 18 images · non-contrast
Comparison: 07/20/2020

CLINICAL DATA: Dizziness.

EXAM:
CT HEAD WITHOUT CONTRAST
TECHNIQUE: Contiguous axial images were obtained from the base of the skull
through the vertex without intravenous contrast.

[Series 4: head 5.0 h30s · axial · 0.45mm/px · z∈[-92,+38]mm · 9 of 32 slices shown, 12 images]
[im 3/32  brain]
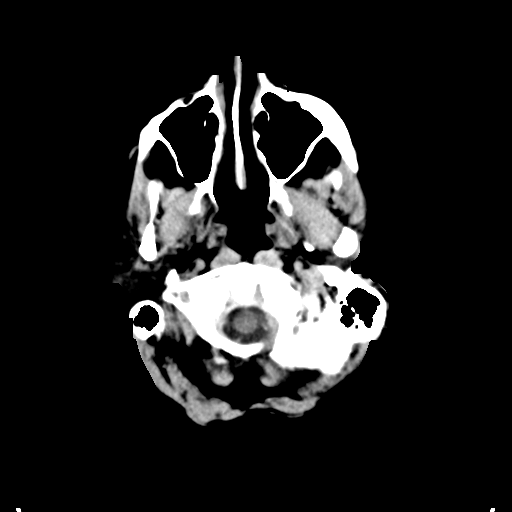
[im 3/32  bone]
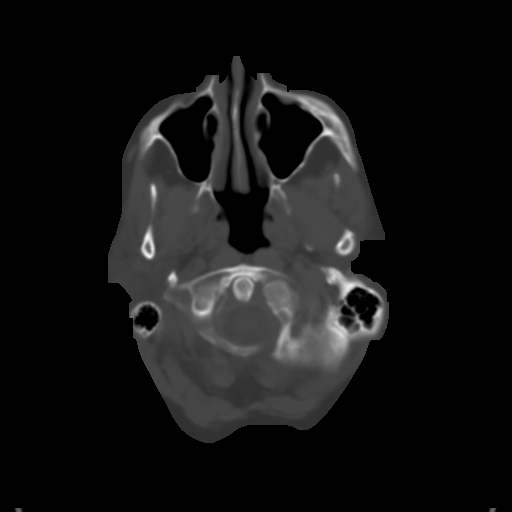
[im 6/32  brain]
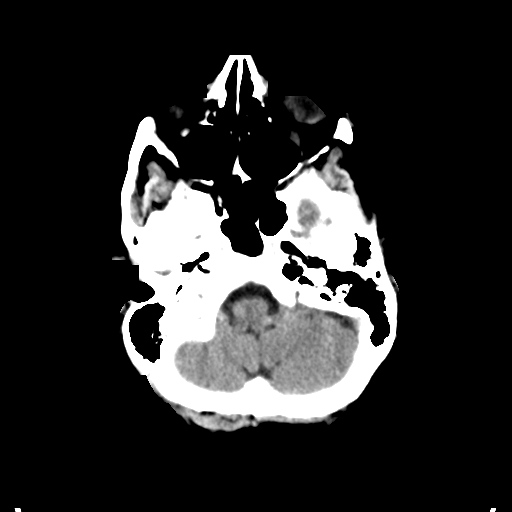
[im 9/32  brain]
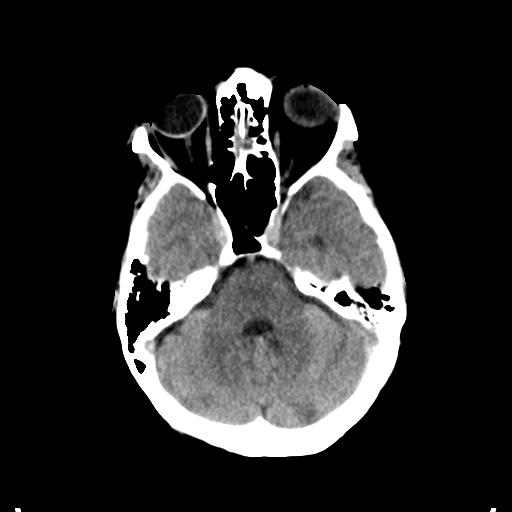
[im 12/32  brain]
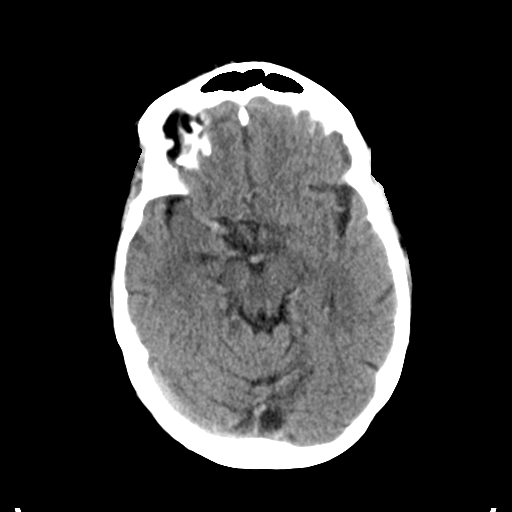
[im 17/32  brain]
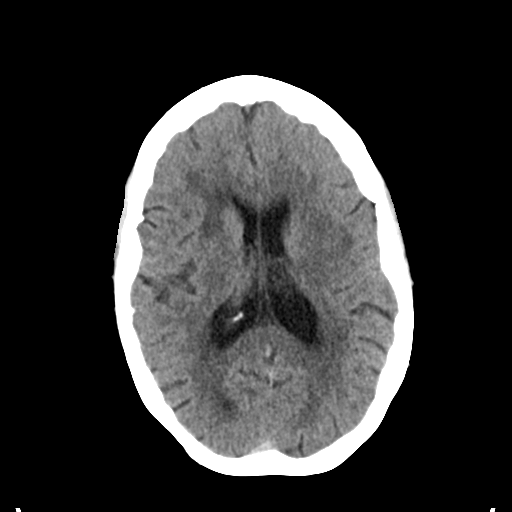
[im 17/32  bone]
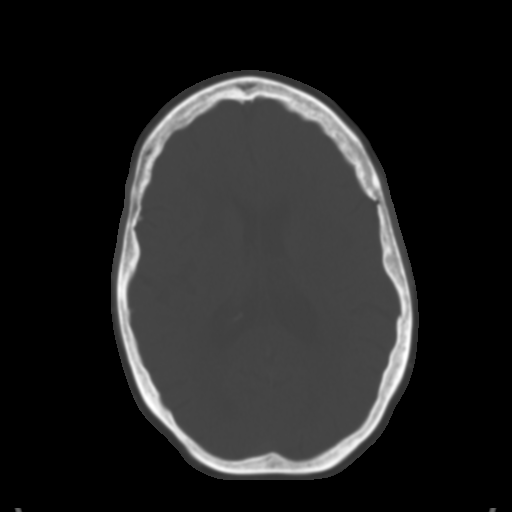
[im 20/32  brain]
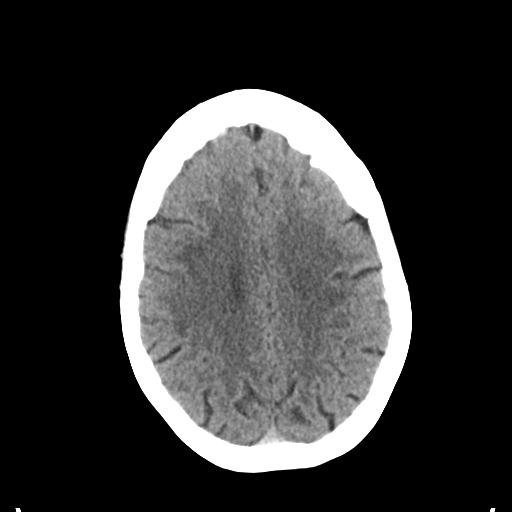
[im 23/32  brain]
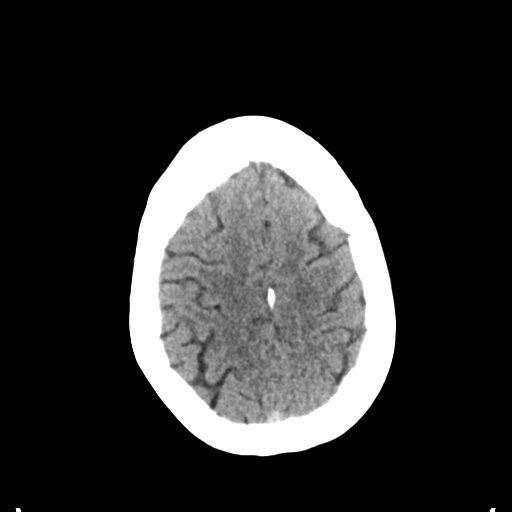
[im 26/32  brain]
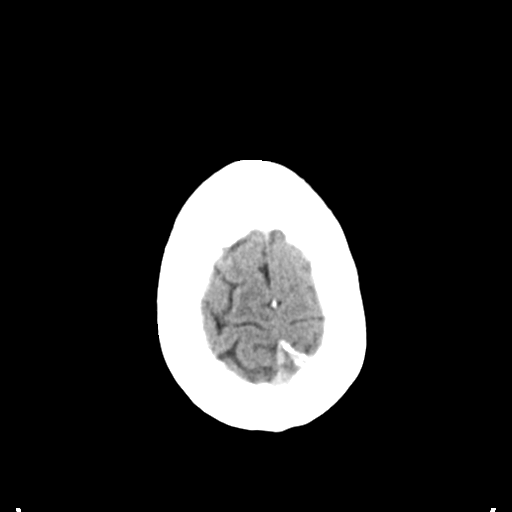
[im 29/32  brain]
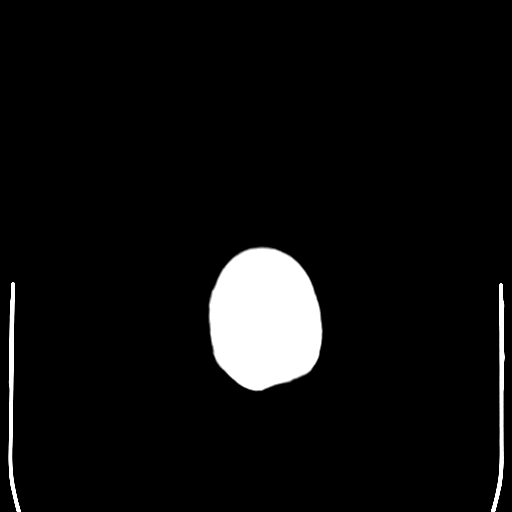
[im 29/32  bone]
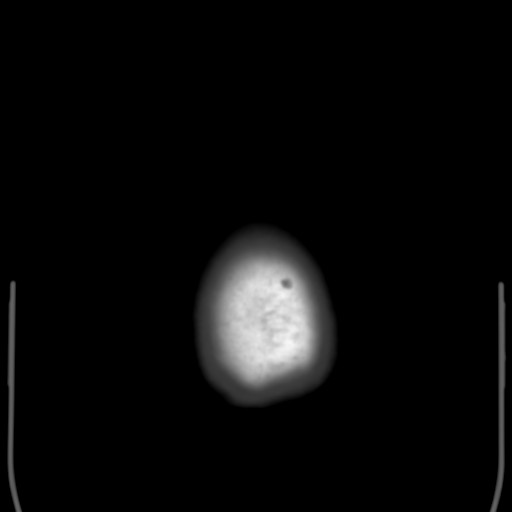

[Series 5: head 3.0 mpr cor · coronal · 0.29mm/px · 3 of 69 slices shown]
[im 23/69  brain]
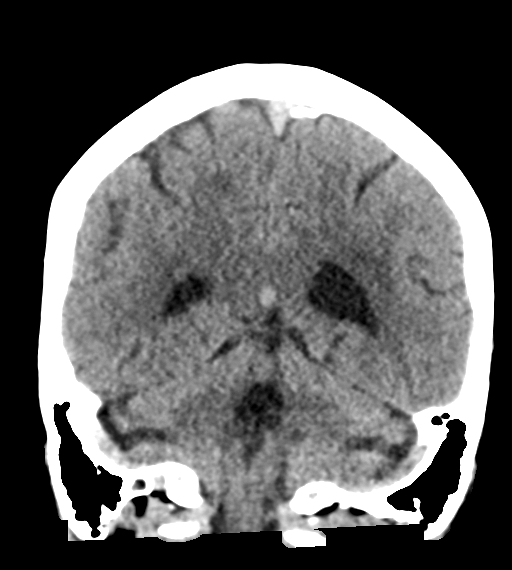
[im 31/69  brain]
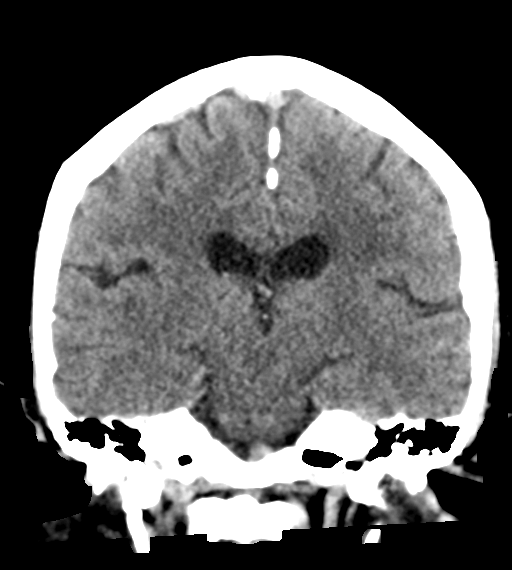
[im 38/69  brain]
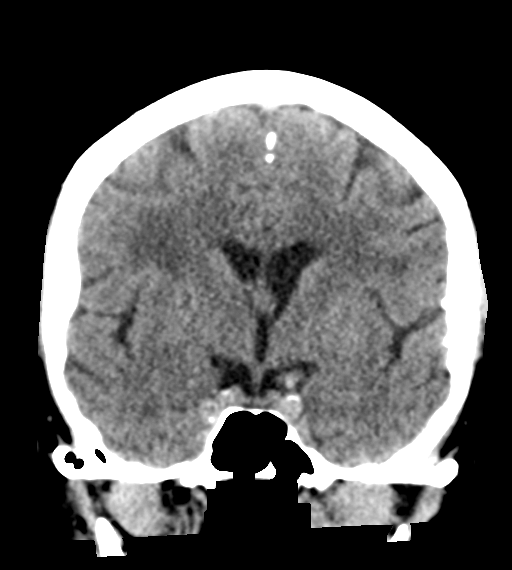

[Series 6: head 3.0 mpr sag · sagittal · 0.32mm/px · 3 of 52 slices shown]
[im 18/52  brain]
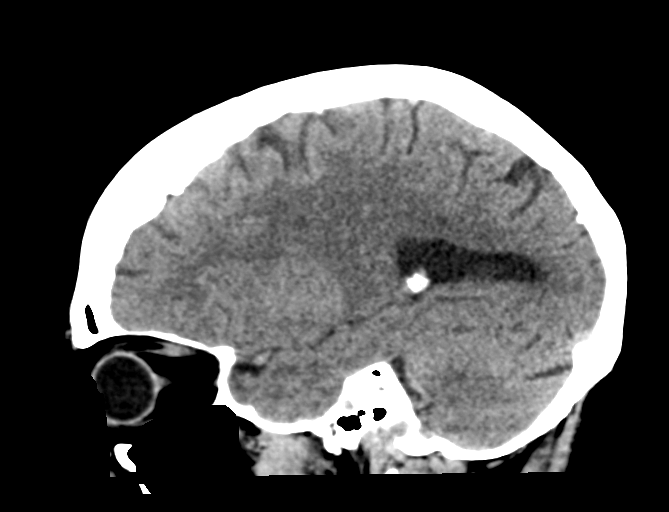
[im 26/52  brain]
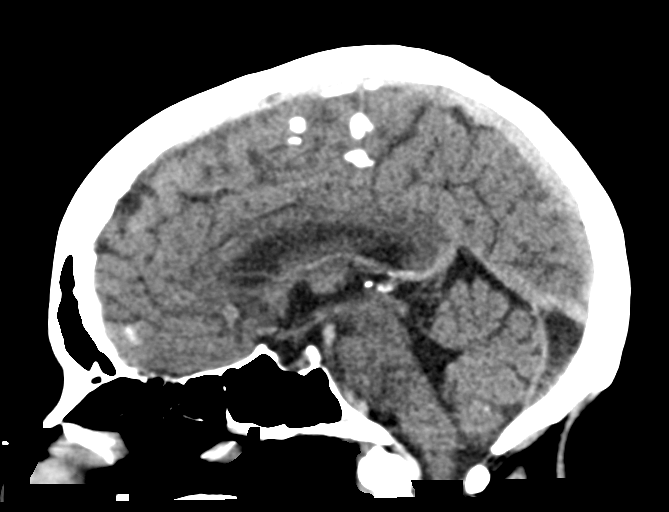
[im 35/52  brain]
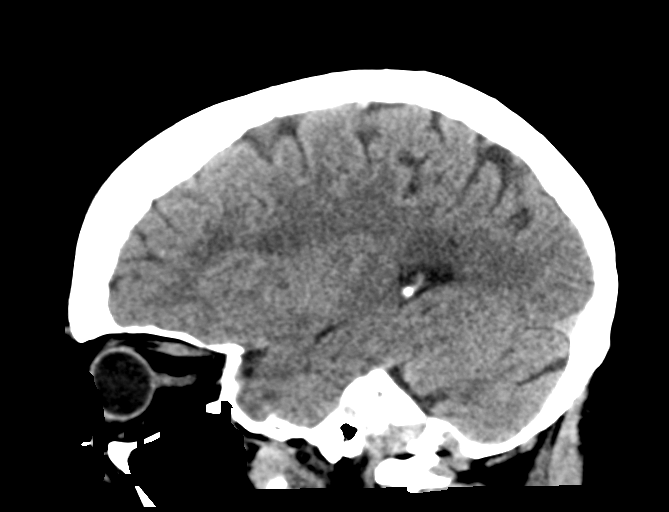

[15 of 47 positions shown; findings below may reference images not displayed]

FINDINGS: Brain: No evidence of acute infarction, hemorrhage, hydrocephalus,
extra-axial collection or mass lesion/mass effect.

Patchy areas of white matter hypoattenuation are noted consistent
with mild chronic microvascular ischemic change, stable compared to
the prior head CT.

Vascular: No hyperdense vessel or unexpected calcification.

Skull: Normal. Negative for fracture or focal lesion.

Sinuses/Orbits: Globes and orbits are unremarkable. Sinuses are
clear.

Other: None.
IMPRESSION: 1. No acute intracranial abnormalities.
2. Chronic small vessel ischemic change. No change from the prior
head CT.

## 2022-09-24 ENCOUNTER — Other Ambulatory Visit: Payer: Self-pay

## 2022-09-24 ENCOUNTER — Encounter (HOSPITAL_BASED_OUTPATIENT_CLINIC_OR_DEPARTMENT_OTHER): Payer: Self-pay

## 2022-09-24 ENCOUNTER — Emergency Department (HOSPITAL_BASED_OUTPATIENT_CLINIC_OR_DEPARTMENT_OTHER): Payer: Medicare Other

## 2022-09-24 DIAGNOSIS — S8002XA Contusion of left knee, initial encounter: Secondary | ICD-10-CM | POA: Diagnosis not present

## 2022-09-24 DIAGNOSIS — Z79899 Other long term (current) drug therapy: Secondary | ICD-10-CM | POA: Insufficient documentation

## 2022-09-24 DIAGNOSIS — S42294A Other nondisplaced fracture of upper end of right humerus, initial encounter for closed fracture: Secondary | ICD-10-CM | POA: Diagnosis not present

## 2022-09-24 DIAGNOSIS — Z7901 Long term (current) use of anticoagulants: Secondary | ICD-10-CM | POA: Insufficient documentation

## 2022-09-24 DIAGNOSIS — W010XXA Fall on same level from slipping, tripping and stumbling without subsequent striking against object, initial encounter: Secondary | ICD-10-CM | POA: Insufficient documentation

## 2022-09-24 DIAGNOSIS — E119 Type 2 diabetes mellitus without complications: Secondary | ICD-10-CM | POA: Insufficient documentation

## 2022-09-24 DIAGNOSIS — S4991XA Unspecified injury of right shoulder and upper arm, initial encounter: Secondary | ICD-10-CM | POA: Diagnosis present

## 2022-09-24 DIAGNOSIS — I10 Essential (primary) hypertension: Secondary | ICD-10-CM | POA: Insufficient documentation

## 2022-09-24 NOTE — ED Triage Notes (Addendum)
Pt arrives with c/o fall after tripping over her dog. Pt c/o bilateral knee, left elbow,  and right shoulder pain. Per pt, she does not think she hit her head. Pt does take plavix. Pt denies LOC. Pt denies head pain or neck pain.

## 2022-09-25 ENCOUNTER — Emergency Department (HOSPITAL_BASED_OUTPATIENT_CLINIC_OR_DEPARTMENT_OTHER)
Admission: EM | Admit: 2022-09-25 | Discharge: 2022-09-25 | Disposition: A | Discharge status: Home / Self Care | Payer: Medicare Other | Attending: Emergency Medicine | Admitting: Emergency Medicine

## 2022-09-25 DIAGNOSIS — S42294A Other nondisplaced fracture of upper end of right humerus, initial encounter for closed fracture: Secondary | ICD-10-CM

## 2022-09-25 DIAGNOSIS — S8002XA Contusion of left knee, initial encounter: Secondary | ICD-10-CM

## 2022-09-25 DIAGNOSIS — W19XXXA Unspecified fall, initial encounter: Secondary | ICD-10-CM

## 2022-09-25 NOTE — ED Notes (Signed)
Pt ambulatory to bathroom, one person assistance.  

## 2022-09-25 NOTE — ED Notes (Signed)
D/c paperwork reviewed with pt, including follow up care.  All questions and/or concerns addressed at time of d/c.  No further needs expressed. . Pt verbalized understanding, Wheeled by ED staff to ED exit, NAD.   

## 2022-09-25 NOTE — ED Provider Notes (Signed)
[facility] Provider Note   CSN: [id number] Arrival date & time: 09/24/22  2211     History  Chief Complaint  Patient presents with   Monica Rivers is a 77 y.o. female.  The history is provided by the patient and medical records.  Fall  Monica Rivers is a 77 y.o. female who presents to the Emergency Department complaining of fall.  She presents to the urgency department for evaluation of injuries following a mechanical fall that occurred earlier today.  She states that she was going to turn and tripped over her dog and fell onto her left side and both knees.  She complains of significant pain to her right shoulder.  No loss of consciousness.  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  She takes Plavix. No reported recent illnesses.  No chest pain, Donnell pain, nausea, vomiting, fevers, dysuria.     Home Medications Prior to Admission medications   Medication Sig Start Date End Date Taking? Authorizing Provider  cholecalciferol (VITAMIN D3) 25 MCG (1000 UNIT) tablet Take 2,000 Units by mouth daily.    [provider]  clopidogrel (PLAVIX) 75 MG tablet Take 1 tablet (75 mg total) by mouth daily. Will need refills from PCP 07/22/20   Bobbye Morton, MD  LORazepam (ATIVAN) 0.5 MG tablet Take by mouth. 01/08/20   [provider]  losartan-hydrochlorothiazide (HYZAAR) 100-25 MG tablet Take 1 tablet by mouth daily. 06/06/20   [provider]  pantoprazole (PROTONIX) 20 MG tablet Take 20 mg by mouth daily.    [provider]      Allergies    Morphine and codeine, Other, Codeine, and Penicillin g    Review of Systems   Review of Systems  All other systems reviewed and are negative.   Physical Exam Updated Vital Signs BP (!) 154/71   Pulse 93   Temp 98.1 F (36.7 C) (Oral)   Resp 19   Wt 80.7 kg   SpO2 96%   BMI  31.04 kg/m  Physical Exam Vitals and nursing note reviewed.  Constitutional:      Appearance: She is well-developed.  HENT:     Head: Normocephalic and atraumatic.  Cardiovascular:     Rate and Rhythm: Normal rate and regular rhythm.     Heart sounds: No murmur heard. Pulmonary:     Effort: Pulmonary effort is normal. No respiratory distress.     Breath sounds: Normal breath sounds.  Abdominal:     Palpations: Abdomen is soft.     Tenderness: There is no abdominal tenderness. There is no guarding or rebound.  Musculoskeletal:     Comments: 2+ radial pulses bilaterally.  There is tenderness to palpation over the right shoulder with decreased range of motion.  There is no tenderness over the elbow.  She is able to fully flex and extend at the elbow.  She has mild tenderness to palpation over bilateral knees, left greater than right.  She is able to fully flex and extend the knees.    Skin:    General: Skin is warm and dry.  Neurological:     Mental Status: She is alert and oriented to person, place, and time.  Psychiatric:        Behavior: Behavior normal.     ED Results / Procedures / Treatments   Labs (all labs ordered are listed,  but only abnormal results are displayed) Labs Reviewed - No data to display  EKG None  Radiology DG Elbow Complete Left  Result Date: 09/25/2022 CLINICAL DATA:  Recent fall with left elbow pain, initial encounter EXAM: LEFT ELBOW - COMPLETE 3+ VIEW COMPARISON:  None Available. FINDINGS: There is no evidence of fracture, dislocation, or joint effusion. There is no evidence of arthropathy or other focal bone abnormality. Soft tissues are unremarkable. IMPRESSION: No acute abnormality noted. Electronically Signed   By: Alcide Clever M.D.   On: 09/25/2022 00:26   DG Knee Complete 4 Views Left  Result Date: 09/25/2022 CLINICAL DATA:  Recent trip and fall with knee pain, initial encounter EXAM: LEFT KNEE - COMPLETE 4+ VIEW COMPARISON:  None Available.  FINDINGS: Very mild tricompartmental degenerative changes are noted similar to that seen on the right. No soft tissue abnormality is seen. No fracture is noted. IMPRESSION: Mild degenerative change without acute abnormality. Electronically Signed   By: Alcide Clever M.D.   On: 09/25/2022 00:25   DG Shoulder Right  Result Date: 09/25/2022 CLINICAL DATA:  Recent fall with right shoulder pain, initial encounter EXAM: RIGHT SHOULDER - 2+ VIEW COMPARISON:  None Available. FINDINGS: There is an undisplaced fracture of the greater tuberosity of the proximal humerus. No dislocation is seen. No other focal abnormality is noted. IMPRESSION: Undisplaced greater tuberosity fracture Electronically Signed   By: Alcide Clever M.D.   On: 09/25/2022 00:25   DG Knee Complete 4 Views Right  Result Date: 09/25/2022 CLINICAL DATA:  Recent fall with right knee pain, initial encounter EXAM: RIGHT KNEE - COMPLETE 4+ VIEW COMPARISON:  None Available. FINDINGS: Mild tricompartmental degenerative changes are noted. No acute fracture or dislocation is noted. No joint effusion is seen. IMPRESSION: Mild degenerative change without acute abnormality. Electronically Signed   By: Alcide Clever M.D.   On: 09/25/2022 00:24   CT Head Wo Contrast  Result Date: 09/25/2022 CLINICAL DATA:  Fall EXAM: CT HEAD WITHOUT CONTRAST CT CERVICAL SPINE WITHOUT CONTRAST TECHNIQUE: Multidetector CT imaging of the head and cervical spine was performed following the standard protocol without intravenous contrast. Multiplanar CT image reconstructions of the cervical spine were also generated. RADIATION DOSE REDUCTION: This exam was performed according to the departmental dose-optimization program which includes automated exposure control, adjustment of the mA and/or kV according to patient size and/or use of iterative reconstruction technique. COMPARISON:  07/28/2020 (CT head) FINDINGS: CT HEAD FINDINGS Brain: Normal anatomic configuration. Parenchymal volume loss  is commensurate with the patient's age. Moderate, mildly progressive periventricular white matter changes are present likely reflecting the sequela of small vessel ischemia. No abnormal intra or extra-axial mass lesion or fluid collection. No abnormal mass effect or midline shift. No evidence of acute intracranial hemorrhage or infarct. Ventricular size is normal. Cerebellum unremarkable. Vascular: No asymmetric hyperdense vasculature at the skull base. Skull: Intact Sinuses/Orbits: Paranasal sinuses are clear. Orbits are unremarkable. Other: Mastoid air cells and middle ear cavities are clear. CT CERVICAL SPINE FINDINGS Alignment: Normal. Skull base and vertebrae: Craniocervical alignment is normal. Atlantodental interval is not widened. No acute fracture. Vertebral body height is preserved. Soft tissues and spinal canal: No prevertebral fluid or swelling. No visible canal hematoma. Disc levels: There is intervertebral disc space narrowing of C4-C7 in keeping with changes of moderate degenerative disc disease. Prevertebral soft tissues are not thickened on sagittal reformats. Posterior calcified disc osteophyte complex at C4-5 mildly narrows the spinal canal with abutment and mild deformity of the thecal sac.  The spinal canal is otherwise widely patent. Uncovertebral arthrosis results in moderate to severe left neuroforaminal narrowing at C6-7. No other significant neuroforaminal narrowing identified. Upper chest: Biapical scarring noted. Multiple bilateral thyroid nodules are identified, largest 14 mm nodule noted within the left thyroid gland, unlikely of clinical significance. No follow-up imaging is recommended for this lesion. Other: None IMPRESSION: 1. No acute intracranial abnormality. No calvarial fracture. 2. Progressive moderate periventricular white matter changes, likely reflecting the sequela of small vessel ischemia. 3. No acute fracture or listhesis of the cervical spine. 4. 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 5. Multiple bilateral thyroid nodules, largest 14 mm nodule noted within the left thyroid gland, unlikely of clinical significance. Electronically Signed   By: Helyn Numbers M.D.   On: 09/25/2022 00:07   CT Cervical Spine Wo Contrast  Result Date: 09/25/2022 CLINICAL DATA:  Fall EXAM: CT HEAD WITHOUT CONTRAST CT CERVICAL SPINE WITHOUT CONTRAST TECHNIQUE: Multidetector CT imaging of the head and cervical spine was performed following the standard protocol without intravenous contrast. Multiplanar CT image reconstructions of the cervical spine were also generated. RADIATION DOSE REDUCTION: This exam was performed according to the departmental dose-optimization program which includes automated exposure control, adjustment of the mA and/or kV according to patient size and/or use of iterative reconstruction technique. COMPARISON:  07/28/2020 (CT head) FINDINGS: CT HEAD FINDINGS Brain: Normal anatomic configuration. Parenchymal volume loss is commensurate with the patient's age. Moderate, mildly progressive periventricular white matter changes are present likely reflecting the sequela of small vessel ischemia. No abnormal intra or extra-axial mass lesion or fluid collection. No abnormal mass effect or midline shift. No evidence of acute intracranial hemorrhage or infarct. Ventricular size is normal. Cerebellum unremarkable. Vascular: No asymmetric hyperdense vasculature at the skull base. Skull: Intact Sinuses/Orbits: Paranasal sinuses are clear. Orbits are unremarkable. Other: Mastoid air cells and middle ear cavities are clear. CT CERVICAL SPINE FINDINGS Alignment: Normal. Skull base and vertebrae: Craniocervical alignment is normal. Atlantodental interval is not widened. No acute fracture. Vertebral body height is preserved. Soft tissues and spinal canal: No prevertebral fluid or  swelling. No visible canal hematoma. Disc levels: There is intervertebral disc space narrowing of C4-C7 in keeping with changes of moderate degenerative disc disease. Prevertebral soft tissues are not thickened on sagittal reformats. Posterior calcified disc osteophyte complex at C4-5 mildly narrows the spinal canal with abutment and mild deformity of the thecal sac. The spinal canal is otherwise widely patent. Uncovertebral arthrosis results in moderate to severe left neuroforaminal narrowing at C6-7. No other significant neuroforaminal narrowing identified. Upper chest: Biapical scarring noted. Multiple bilateral thyroid nodules are identified, largest 14 mm nodule noted within the left thyroid gland, unlikely of clinical significance. No follow-up imaging is recommended for this lesion. Other: None IMPRESSION: 1. No acute intracranial abnormality. No calvarial fracture. 2. Progressive moderate periventricular white matter changes, likely reflecting the sequela of small vessel ischemia. 3. No acute fracture or listhesis of the cervical spine. 4. Degenerative disc and degenerative joint disease resulting in mild spinal canal stenosis and moderate to severe left neuroforaminal narrowing at C6-7. 5. Multiple bilateral thyroid nodules, largest 14 mm nodule noted within the left thyroid gland, unlikely of clinical significance. Electronically Signed   By: Helyn Numbers M.D.   On: 09/25/2022 00:07    Procedures Procedures    Medications Ordered in ED Medications - No data to display  ED Course/ Medical  Decision Making/ A&P                             Medical Decision Making Amount and/or Complexity of Data Reviewed Radiology: ordered.   Patient here for evaluation of injuries following a mechanical fall at home.  She does have a greater tuberosity fracture of her right humerus-images personally reviewed and interpreted, agree with radiologist interpretation.  No evidence of additional fracture or  serious injury.  Patient placed in sling for comfort.  Discussed orthopedics follow-up.  Discussed return precautions for new or concerning symptoms.        Final Clinical Impression(s) / ED Diagnoses Final diagnoses:  Fall, initial encounter  Other closed nondisplaced fracture of proximal end of right humerus, initial encounter  Contusion of left knee, initial encounter    Rx / DC Orders ED Discharge Orders     None         Tilden Fossa, MD 09/25/22 [phone]

## 2022-11-07 ENCOUNTER — Inpatient Hospital Stay: Payer: Medicare Other | Admitting: Hematology and Oncology

## 2022-11-07 ENCOUNTER — Inpatient Hospital Stay: Payer: Medicare Other

## 2022-11-07 ENCOUNTER — Other Ambulatory Visit: Payer: Self-pay

## 2022-11-07 ENCOUNTER — Inpatient Hospital Stay: Payer: Medicare Other | Attending: Hematology and Oncology

## 2022-11-07 ENCOUNTER — Encounter: Payer: Self-pay | Admitting: Hematology and Oncology

## 2022-11-07 VITALS — BP 130/72

## 2022-11-07 VITALS — BP 116/101 | HR 81 | Temp 97.6°F | Resp 18 | Wt 181.6 lb

## 2022-11-07 DIAGNOSIS — R748 Abnormal levels of other serum enzymes: Secondary | ICD-10-CM

## 2022-11-07 DIAGNOSIS — D751 Secondary polycythemia: Secondary | ICD-10-CM

## 2022-11-07 DIAGNOSIS — I10 Essential (primary) hypertension: Secondary | ICD-10-CM | POA: Insufficient documentation

## 2022-11-07 DIAGNOSIS — D471 Chronic myeloproliferative disease: Secondary | ICD-10-CM | POA: Insufficient documentation

## 2022-11-07 LAB — CBC WITH DIFFERENTIAL/PLATELET
Abs Immature Granulocytes: 0.01 10*3/uL (ref 0.00–0.07)
Basophils Absolute: 0.1 10*3/uL (ref 0.0–0.1)
Basophils Relative: 1 %
Eosinophils Absolute: 0.2 10*3/uL (ref 0.0–0.5)
Eosinophils Relative: 4 %
HCT: 44.5 % (ref 36.0–46.0)
Hemoglobin: 15.5 g/dL — ABNORMAL HIGH (ref 12.0–15.0)
Immature Granulocytes: 0 %
Lymphocytes Relative: 25 %
Lymphs Abs: 1.4 10*3/uL (ref 0.7–4.0)
MCH: 28.7 pg (ref 26.0–34.0)
MCHC: 34.8 g/dL (ref 30.0–36.0)
MCV: 82.4 fL (ref 80.0–100.0)
Monocytes Absolute: 0.5 10*3/uL (ref 0.1–1.0)
Monocytes Relative: 10 %
Neutro Abs: 3.3 10*3/uL (ref 1.7–7.7)
Neutrophils Relative %: 60 %
Platelets: 183 10*3/uL (ref 150–400)
RBC: 5.4 MIL/uL — ABNORMAL HIGH (ref 3.87–5.11)
RDW: 13.6 % (ref 11.5–15.5)
WBC: 5.5 10*3/uL (ref 4.0–10.5)
nRBC: 0 % (ref 0.0–0.2)

## 2022-11-07 LAB — CMP (CANCER CENTER ONLY)
ALT: 92 U/L — ABNORMAL HIGH (ref 0–44)
AST: 71 U/L — ABNORMAL HIGH (ref 15–41)
Albumin: 4.2 g/dL (ref 3.5–5.0)
Alkaline Phosphatase: 85 U/L (ref 38–126)
Anion gap: 7 (ref 5–15)
BUN: 30 mg/dL — ABNORMAL HIGH (ref 8–23)
CO2: 28 mmol/L (ref 22–32)
Calcium: 11 mg/dL — ABNORMAL HIGH (ref 8.9–10.3)
Chloride: 101 mmol/L (ref 98–111)
Creatinine: 1.17 mg/dL — ABNORMAL HIGH (ref 0.44–1.00)
GFR, Estimated: 48 mL/min — ABNORMAL LOW (ref >60)
Glucose, Bld: 203 mg/dL — ABNORMAL HIGH (ref 70–99)
Potassium: 3.7 mmol/L (ref 3.5–5.1)
Sodium: 136 mmol/L (ref 135–145)
Total Bilirubin: 0.6 mg/dL (ref 0.3–1.2)
Total Protein: 6.8 g/dL (ref 6.5–8.1)

## 2022-11-07 LAB — FERRITIN: Ferritin: 98 ng/mL (ref 11–307)

## 2022-11-07 NOTE — Assessment & Plan Note (Signed)
Her blood pressure is high today could be related to whitecoat hypertension versus elevated hemoglobin causing hypertension We will proceed with treatment For now, she will continue her current antihypertensives

## 2022-11-07 NOTE — Assessment & Plan Note (Signed)
This is improved with recent phlebotomy I will see her in 2 months

## 2022-11-07 NOTE — Progress Notes (Signed)
Steele Berg Brearley presents today for phlebotomy per MD orders. 16G phlebotomy kit used in LAC.  Phlebotomy procedure started at 1315 and ended at 1325. 510 grams removed. Patient observed for 28 minutes after procedure without any incident, declined full observation period.  Patient tolerated procedure well. IV needle removed intact.

## 2022-11-07 NOTE — Progress Notes (Signed)
[facility] OFFICE PROGRESS NOTE  Patient Care Team: Eather Colas, FNP as PCP - General (Nurse Practitioner) Ihor Austin, NP as Nurse Practitioner (Neurology)  ASSESSMENT & PLAN:  MPN (myeloproliferative neoplasm) (HCC) Her hemoglobin has increased a bit but she is not symptomatic We will proceed with phlebotomy  Polycythemia, secondary This is improved with recent phlebotomy I will see her in 2 months   Hypertension Her blood pressure is high today could be related to whitecoat hypertension versus elevated hemoglobin causing hypertension We will proceed with treatment For now, she will continue her current antihypertensives  No orders of the defined types were placed in this encounter.   All questions were answered. The patient knows to call the clinic with any problems, questions or concerns. The total time spent in the appointment was 20 minutes encounter with patients including review of chart and various tests results, discussions about plan of care and coordination of care plan   Artis Delay, MD 11/07/2022 12:20 PM  INTERVAL HISTORY: Please see below for problem oriented charting. she returns for treatment follow-up She denies headache No recent new diagnosis of blood clot The patient tries to hydrate but possibly not adequately She denies ill effect from phlebotomy  REVIEW OF SYSTEMS:   Constitutional: Denies fevers, chills or abnormal weight loss Eyes: Denies blurriness of vision Ears, nose, mouth, throat, and face: Denies mucositis or sore throat Respiratory: Denies cough, dyspnea or wheezes Cardiovascular: Denies palpitation, chest discomfort or lower extremity swelling Gastrointestinal:  Denies nausea, heartburn or change in bowel habits Skin: Denies abnormal skin rashes Lymphatics: Denies new lymphadenopathy or easy bruising Neurological:Denies numbness, tingling or new weaknesses Behavioral/Psych: Mood is stable, no new changes  All  other systems were reviewed with the patient and are negative.  I have reviewed the past medical history, past surgical history, social history and family history with the patient and they are unchanged from previous note.  ALLERGIES:  is allergic to morphine and codeine, other, codeine, and penicillin g.  MEDICATIONS:  Current Outpatient Medications  Medication Sig Dispense Refill   cholecalciferol (VITAMIN D3) 25 MCG (1000 UNIT) tablet Take 2,000 Units by mouth daily.     clopidogrel (PLAVIX) 75 MG tablet Take 1 tablet (75 mg total) by mouth daily. Will need refills from PCP 30 tablet 0   LORazepam (ATIVAN) 0.5 MG tablet Take by mouth.     losartan-hydrochlorothiazide (HYZAAR) 100-25 MG tablet Take 1 tablet by mouth daily.     pantoprazole (PROTONIX) 20 MG tablet Take 20 mg by mouth daily.     No current facility-administered medications for this visit.    SUMMARY OF ONCOLOGIC HISTORY:  She had a stroke approximately a year and a half ago and at the time of evaluation/admission, she was noted to have high hemoglobin The patient had 50+ pack year of smoking, only quit since the stroke diagnosis Since her history of stroke, she has residual dizziness, visual difficulties and balance problems.  She denies recent falls She had extensive evaluation by her primary care doctor recently and was noted to have several abnormal blood work including persistent polycythemia as well as abnormal iron studies.  She was also noted to have abnormal liver function and had ultrasound evaluation.  Iron studies was noted to be grossly abnormal  She denies intermittent headaches, shortness of breath on exertion, frequent leg cramps or chest pain.   There is no prior diagnosis of obstructive sleep apnea. The patient denies weight loss or skin itching.  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 There were no vitals filed for this visit.  GENERAL:alert, no distress and comfortable NEURO: alert & oriented x 3 with fluent speech, no focal motor/sensory deficits  LABORATORY DATA:  I have reviewed the data as listed    Component Value Date/Time   NA 137 08/22/2022 1155   K 3.9 08/22/2022 1155   CL 101 08/22/2022 1155   CO2 31 08/22/2022 1155   GLUCOSE 148 (H) 08/22/2022 1155   BUN 35 (H) 08/22/2022 1155   CREATININE 1.17 (H) 08/22/2022 1155   CALCIUM 10.9 (H) 08/22/2022 1155   CALCIUM 9.1 07/03/2022 1422   PROT 6.6 08/22/2022 1155   ALBUMIN 4.2 08/22/2022 1155   AST 55 (H) 08/22/2022 1155   ALT 87 (H) 08/22/2022 1155   ALKPHOS 76 08/22/2022 1155   BILITOT 0.5 08/22/2022 1155   GFRNONAA 49 (L) 08/22/2022 1155    No results found for: "SPEP", "UPEP"  Lab Results  Component Value Date   WBC 5.5 11/07/2022   NEUTROABS 3.3 11/07/2022   HGB 15.5 (H) 11/07/2022   HCT 44.5 11/07/2022   MCV 82.4 11/07/2022   PLT 183 11/07/2022      Chemistry      Component Value Date/Time   NA 137 08/22/2022 1155   K 3.9 08/22/2022 1155   CL 101 08/22/2022 1155   CO2 31 08/22/2022 1155   BUN 35 (H) 08/22/2022 1155   CREATININE 1.17 (H) 08/22/2022 1155      Component Value Date/Time   CALCIUM 10.9 (H) 08/22/2022 1155   CALCIUM 9.1 07/03/2022 1422   ALKPHOS 76 08/22/2022 1155   AST 55 (H) 08/22/2022 1155   ALT 87 (H) 08/22/2022 1155   BILITOT 0.5 08/22/2022 1155

## 2022-11-07 NOTE — Assessment & Plan Note (Signed)
Her hemoglobin has increased a bit but she is not symptomatic We will proceed with phlebotomy

## 2022-11-22 NOTE — Therapy (Signed)
OUTPATIENT PHYSICAL THERAPY SHOULDER EVALUATION   Patient Name: Monica Rivers MRN: [id number] DOB:1945/09/24, 77 y.o., female Today's Date: 11/28/2022   END OF SESSION:  PT End of Session - 11/28/22 1402     Visit Number 1    Date for PT Re-Evaluation 01/23/23    Authorization Type Medicare & [employer]    PT Start Time 1402    PT Stop Time 1449    PT Time Calculation (min) 47 min    Activity Tolerance Patient tolerated treatment well    Behavior During Therapy [employer] for tasks assessed/performed             Past Medical History:  Diagnosis Date   Acute ischemic stroke (HCC) 07/19/2020   Anxiety 08/05/2015   CKD (chronic kidney disease) stage 3, GFR 30-59 ml/min (HCC) 08/14/2016   GERD (gastroesophageal reflux disease) 01/31/2016   Hypertension 08/05/2015   Pre-diabetes 08/05/2015   Vitamin D insufficiency 08/28/2016   No past surgical history on file. 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 02/20/2022   Acute ischemic stroke (HCC) 07/19/2020   Statin medication declined by patient 01/05/2020   Vitamin D insufficiency 08/28/2016   CKD (chronic kidney disease) stage 3, GFR 30-59 ml/min (HCC) 08/14/2016   GERD (gastroesophageal reflux disease) 01/31/2016   Anxiety 08/05/2015   Hypertension 08/05/2015   Pre-diabetes 08/05/2015    PCP: Eather Colas, FNP   REFERRING PROVIDER: Colvin Caroli, PA-C  REFERRING DIAG: [phone] (ICD-10-CM) - Unspecified fracture of upper end of right humerus, subsequent encounter for fracture with routine healing   THERAPY DIAG:  Stiffness of right shoulder, not elsewhere  classified  Acute pain of right shoulder  Muscle weakness (generalized)  Abnormal posture  RATIONALE FOR EVALUATION AND TREATMENT: Rehabilitation  ONSET DATE: 09/24/2022  NEXT MD VISIT: TBD   SUBJECTIVE:                                                                                                                                                                                                         SUBJECTIVE STATEMENT: Pt reports she tripped over her dog on 09/24/2022 and fell on the tile floor landing on her L side but fracturing her R proximal humerus (not sure how  she did it). No surgery but immobilized until last week. She reports she is R handed and having to learn to do things L handed sometimes has caused her more pain. Uses SPC when going out since her CVA, but does not use the cane at home.  PAIN: Are you having pain? Yes: NPRS scale: 0/10 currently, randomly 6-7/10 Pain location: R upper lateral chest and occasionally lateral upper arm Pain description: nerve pain in chest, bony ache in arm Aggravating factors: sleeping - laying on back, esp  Relieving factors: upright sitting  PERTINENT HISTORY:  Ischemic CVA 06/2020; HTN; prediabetes; CKD-stage 3; anxiety; GERD  PRECAUTIONS: None  RED FLAGS: None  HAND DOMINANCE: Right  WEIGHT BEARING RESTRICTIONS: No  FALLS:  Has patient fallen in last 6 months? Yes. 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 Also be able to feed herself easier and run the vacuum."   OBJECTIVE:  (objective measures completed at initial evaluation unless otherwise dated)  DIAGNOSTIC FINDINGS:  11/22/22 - R shoulder x-ray: reviewed/interpreted ap/outlet views of right shoulder showing healing proximal humerus fracture in good position - recommend physical therapy for the next 4 weeks then transition to HEP - pt in agreement - may d/c sling f/u in 4 weeks - for recheck if needed   10/25/22 - R shoulder x-ray: ap/outlet views of right shoulder showing healing greater tuberosity fracture in good position and placement - recommend gentle physical therapy to work on rom and isometrics - f/u in 4 weeks for recheck   PATIENT SURVEYS:  Quick Dash 50.0 / 100 = 50.0 %  COGNITION: Overall cognitive status: Within functional limits for tasks assessed     SENSATION: WFL  POSTURE: rounded shoulders and forward head  UPPER EXTREMITY ROM:   Active ROM Right eval Left eval  Shoulder flexion 77 141  Shoulder extension 34 59  Shoulder abduction 53 162 *  Shoulder adduction    Shoulder internal rotation FIR lateral buttock FIR WFL  Shoulder external rotation 30 FER unable 72 FER T4  Elbow flexion    Elbow extension    Wrist flexion    Wrist extension    Wrist ulnar deviation    Wrist radial deviation    Wrist pronation    Wrist supination    (Blank rows = not tested)  UPPER EXTREMITY MMT:  MMT Right eval Left eval  Shoulder flexion 2- 5  Shoulder extension 2- 5  Shoulder abduction 2- 5  Shoulder adduction    Shoulder internal rotation 2- 5  Shoulder external rotation 2- 4  Middle trapezius    Lower trapezius    Elbow flexion    Elbow extension    Wrist flexion    Wrist extension    Wrist ulnar deviation    Wrist radial deviation    Wrist pronation    Wrist supination    Grip strength (lbs)    (Blank rows = not tested)  PALPATION:  Increased muscle tension and TTP in R pecs and anterolateral deltoids    TODAY'S TREATMENT:   11/28/22 -  Eval THERAPEUTIC EXERCISE: to  improve flexibility, strength and mobility.  Demonstration, verbal and tactile cues throughout for technique. Seated scapular retraction and depression 10 x 5" Seated R shoulder flexion and scaption table slides - pt reporting she thinks her table will be too high for this at home Seated R shoulder flexion and scaption AAROM using Swiffer as UE ranger x 10 each Supine R shoulder flexion AAROM with cane x 5  Supine R shoulder ER AAROM with cane and shoulder elevated on towel roll in ~30-45 abduction/scaption x 10 - cues for proper angle of cane as well as keeping R elbow flexed to 90 Seated R shoulder ER AAROM with cane x 10 - cues to keep elbow tucked at side at 90 flexion and avoid straightening elbow   PATIENT EDUCATION:  Education details: PT eval findings, anticipated POC, and initial HEP  Person educated: Patient Education method: Explanation, Demonstration, Tactile cues, Verbal cues, and Handouts Education comprehension: verbalized understanding, returned demonstration, verbal cues required, tactile cues required, and needs further education  HOME EXERCISE PROGRAM: Access Code: [email or web address] URL: [email or web address] Date: 11/28/2022 Prepared by: Glenetta Hew  Exercises - Seated Scapular Retraction  - 3 x daily - 7 x weekly - 2 sets - 10 reps - 5 sec hold - Seated Shoulder Flexion Extension AAROM with Dowel into Wall (Mirrored)  - 2-3 x daily - 7 x weekly - 2 sets - 10 reps - 3 sec hold - Supine Shoulder Flexion AAROM with Dowel (Mirrored)  - 2-3 x daily - 7 x weekly - 2 sets - 10 reps - 3 sec hold - Supine Shoulder External Rotation in 45 Degrees Abduction AAROM with Dowel (Mirrored)  - 2-3 x daily - 7 x weekly - 2 sets - 10 reps - 3 sec hold - Seated Shoulder External Rotation AAROM with Cane and Hand in Neutral  - 2-3 x daily - 7 x weekly - 2 sets - 10 reps - 3 sec hold   ASSESSMENT:  CLINICAL IMPRESSION: Monica Rivers is a 77 y.o. female who was assessed today  for physical therapy evaluation and treatment for R shoulder pain and LOM s/p closed fracture of R proximal humerus on 09/24/2022.  Patient reports she was immobilized in a sling until last week, but was told her fracture is healing well and she is okay to initiate movement.  She reports intermittent pain most commonly in anterior and lateral R chest and shoulder, typically worse at night.  Current deficits include postural abnormalities, limited and painful R shoulder ROM, abnormal muscle tension and guarding, and R shoulder weakness.  Monica Rivers is R-handed and current deficits impact majority of daily activities including ADLs.  Monica Rivers will benefit from skilled PT to address above deficits to improve mobility and activity tolerance with decreased pain interference.  She reports she feels uncomfortable in open spaces, and consequently would like to limit her treatment sessions to within a private treatment room whenever possible.  OBJECTIVE IMPAIRMENTS: decreased activity tolerance, decreased knowledge of condition, decreased mobility, decreased ROM, decreased strength, increased fascial restrictions, impaired perceived functional ability, increased muscle spasms, impaired flexibility, impaired UE functional use, improper body mechanics, postural dysfunction, and pain.   ACTIVITY LIMITATIONS: carrying, lifting, sleeping, bed mobility, bathing, toileting, dressing, self feeding, reach over head, hygiene/grooming, and caring for others  PARTICIPATION LIMITATIONS: meal prep, cleaning, laundry, driving, shopping, community activity, and yard work  PERSONAL FACTORS: Age, Fitness, Past/current experiences, Time since onset of injury/illness/exacerbation, and 3+ comorbidities: Ischemic CVA 06/2020; HTN;  prediabetes; CKD-stage 3; anxiety; GERD  are also affecting patient's functional outcome.   REHAB POTENTIAL: Good  CLINICAL DECISION MAKING: Evolving/moderate complexity  EVALUATION COMPLEXITY:  Moderate   GOALS: Goals reviewed with patient? Yes  SHORT TERM GOALS: Target date: 12/26/2022  Patient will be independent with initial HEP to improve outcomes and carryover.  Baseline: Initial HEP for AAROM provided on eval Goal status: INITIAL  LONG TERM GOALS: Target date: 01/23/2023  Patient will be independent with ongoing/advanced HEP for self-management at home.  Baseline:  Goal status: INITIAL  2.  Patient will report 50-75% improvement in R shoulder pain to improve QOL.  Baseline: Up to 6-7/10 Goal status: INITIAL  3.  Patient to demonstrate improved upright posture with posterior shoulder girdle engaged to promote improved glenohumeral joint mobility. Baseline: Forward head and rounded shoulders with mildly protracted right shoulder Goal status: INITIAL  4.  Patient to improve R shoulder AROM to [facility] without pain provocation to allow for increased ease of ADLs.  Baseline: Refer to above UE ROM table Goal status: INITIAL  5.  Patient will demonstrate improved R shoulder strength to >/= 4/5 in available ROM for functional UE use. Baseline: Refer to above UE MMT table Goal status: INITIAL  6  Patient will report </= 40% on QuickDASH to demonstrate improved functional ability.  Baseline: 50.0 / 100 = 50.0 % Goal status: INITIAL  7.  Patient will report ability to don/doff shirt over her head without limitation due to R shoulder pain or LOM. Baseline: Patient unable to wear a T-shirt that she has to put over her head. Goal status: INITIAL   8.  Patient will report ability to wash her hair without need for her husband's assistance. Baseline: Patient's husband currently helping her to wash her hair. Goal status: INITIAL   PLAN:  PT FREQUENCY: 1-2x/week - Patient preferring to start with 1x/wk frequency due to transportation issues and discomfort being in open spaces  PT DURATION: 8 weeks  PLANNED INTERVENTIONS: Therapeutic exercises, Therapeutic activity,  Patient/Family education, Self Care, Joint mobilization, Aquatic Therapy, Dry Needling, Electrical stimulation, Cryotherapy, Moist heat, Taping, Vasopneumatic device, Ultrasound, Ionotophoresis 4mg /ml Dexamethasone, Manual therapy, and Re-evaluation  PLAN FOR NEXT SESSION:  *Pt prefers to remain in a individual treatment room whenever possible* Review initial HEP; gentle R shoulder AAROM; introduce R shoulder gentle isometrics; light postural strengthening   Monica Rivers, PT 11/28/2022, 7:32 PM

## 2022-11-28 ENCOUNTER — Ambulatory Visit: Payer: Medicare Other | Attending: Physician Assistant | Admitting: Physical Therapy

## 2022-11-28 ENCOUNTER — Other Ambulatory Visit: Payer: Self-pay

## 2022-11-28 DIAGNOSIS — M25511 Pain in right shoulder: Secondary | ICD-10-CM | POA: Diagnosis present

## 2022-11-28 DIAGNOSIS — M25611 Stiffness of right shoulder, not elsewhere classified: Secondary | ICD-10-CM | POA: Diagnosis present

## 2022-11-28 DIAGNOSIS — R293 Abnormal posture: Secondary | ICD-10-CM | POA: Diagnosis present

## 2022-11-28 DIAGNOSIS — M6281 Muscle weakness (generalized): Secondary | ICD-10-CM | POA: Diagnosis present

## 2022-12-04 ENCOUNTER — Ambulatory Visit: Payer: Medicare Other | Admitting: Physical Therapy

## 2022-12-04 ENCOUNTER — Encounter: Payer: Self-pay | Admitting: Physical Therapy

## 2022-12-04 DIAGNOSIS — M25511 Pain in right shoulder: Secondary | ICD-10-CM

## 2022-12-04 DIAGNOSIS — M25611 Stiffness of right shoulder, not elsewhere classified: Secondary | ICD-10-CM

## 2022-12-04 DIAGNOSIS — R293 Abnormal posture: Secondary | ICD-10-CM

## 2022-12-04 DIAGNOSIS — M6281 Muscle weakness (generalized): Secondary | ICD-10-CM

## 2022-12-04 NOTE — Therapy (Signed)
OUTPATIENT PHYSICAL THERAPY TREATMENT   Patient Name: Monica Rivers MRN: [id number] DOB:[birth year], 77 y.o., female Today's Date: 12/04/2022   END OF SESSION:  PT End of Session - 12/04/22 1101     Visit Number 2    Date for PT Re-Evaluation 01/23/23    Authorization Type Medicare & [employer]    PT Start Time 1101    PT Stop Time 1148    PT Time Calculation (min) 47 min    Activity Tolerance Patient tolerated treatment well    Behavior During Therapy [facility] for tasks assessed/performed              Past Medical History:  Diagnosis Date   Acute ischemic stroke (HCC) 07/19/2020   Anxiety 08/05/2015   CKD (chronic kidney disease) stage 3, GFR 30-59 ml/min (HCC) 08/14/2016   GERD (gastroesophageal reflux disease) 01/31/2016   Hypertension 08/05/2015   Pre-diabetes 08/05/2015   Vitamin D insufficiency 08/28/2016   No past surgical history on file. 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 02/20/2022   Acute ischemic stroke (HCC) 07/19/2020   Statin medication declined by patient 01/05/2020   Vitamin D insufficiency 08/28/2016   CKD (chronic kidney disease) stage 3, GFR 30-59 ml/min (HCC) 08/14/2016   GERD (gastroesophageal reflux disease) 01/31/2016   Anxiety 08/05/2015   Hypertension 08/05/2015   Pre-diabetes 08/05/2015    PCP: Eather Colas, FNP   REFERRING PROVIDER: Colvin Caroli, PA-C  REFERRING DIAG: [id number] (ICD-10-CM) - Unspecified fracture of upper end of right humerus, subsequent encounter for fracture with routine healing   THERAPY DIAG:  Stiffness of right shoulder, not elsewhere classified  Acute  pain of right shoulder  Muscle weakness (generalized)  Abnormal posture  RATIONALE FOR EVALUATION AND TREATMENT: Rehabilitation  ONSET DATE: 09/24/2022  NEXT MD VISIT: TBD   SUBJECTIVE:                                                                                                                                                                                                         SUBJECTIVE STATEMENT: Pt reports increased pain in her R shoulder/upper arm today. She notes some limitation with HEP due to muscle cramps.  EVAL: Pt reports she tripped over her dog  on 09/24/2022 and fell on the tile floor landing on her L side but fracturing her R proximal humerus (not sure how she did it). No surgery but immobilized until last week. She reports she is R handed and having to learn to do things L handed sometimes has caused her more pain. Uses SPC when going out since her CVA, but does not use the cane at home.  PAIN: Are you having pain? Yes: NPRS scale: 6/10 Pain location: R upper lateral chest and occasionally lateral upper arm Pain description: nerve pain in chest, bony ache in arm Aggravating factors: sleeping - laying on back, esp  Relieving factors: upright sitting  PERTINENT HISTORY:  Ischemic CVA 06/2020; HTN; prediabetes; CKD-stage 3; anxiety; GERD  PRECAUTIONS: None  RED FLAGS: None  HAND DOMINANCE: Right  WEIGHT BEARING RESTRICTIONS: No  FALLS:  Has patient fallen in last 6 months? Yes. 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  Also be able to feed herself easier and run the vacuum."   OBJECTIVE: (objective measures completed at initial evaluation unless otherwise dated)  DIAGNOSTIC FINDINGS:  11/22/22 - R shoulder x-ray: reviewed/interpreted ap/outlet views of right shoulder showing healing proximal humerus fracture in good position - recommend physical therapy for the next 4 weeks then transition to HEP - pt in agreement - may d/c sling f/u in 4 weeks - for recheck if needed   10/25/22 - R shoulder x-ray: ap/outlet views of right shoulder showing healing greater tuberosity fracture in good position and placement - recommend gentle physical therapy to work on rom and isometrics - f/u in 4 weeks for recheck   PATIENT SURVEYS:  Quick Dash 50.0 / 100 = 50.0 %  COGNITION: Overall cognitive status: Within functional limits for tasks assessed     SENSATION: WFL  POSTURE: rounded shoulders and forward head  UPPER EXTREMITY ROM:   Active ROM Right eval Left eval  Shoulder flexion 77 141  Shoulder extension 34 59  Shoulder abduction 53 162 *  Shoulder adduction    Shoulder internal rotation FIR lateral buttock FIR WFL  Shoulder external rotation 30 FER unable 72 FER T4  Elbow flexion    Elbow extension    Wrist flexion    Wrist extension    Wrist ulnar deviation    Wrist radial deviation    Wrist pronation    Wrist supination    (Blank rows = not tested)  UPPER EXTREMITY MMT:  MMT Right eval Left eval  Shoulder flexion 2- 5  Shoulder extension 2- 5  Shoulder abduction 2- 5  Shoulder adduction    Shoulder internal rotation 2- 5  Shoulder external rotation 2- 4  Middle trapezius    Lower trapezius    Elbow flexion    Elbow extension    Wrist flexion    Wrist extension    Wrist ulnar deviation    Wrist radial deviation    Wrist pronation    Wrist supination    Grip strength (lbs)    (Blank rows = not tested)  PALPATION:  Increased muscle tension and TTP in R pecs and anterolateral  deltoids    TODAY'S TREATMENT:   12/04/22 THERAPEUTIC EXERCISE: to improve flexibility, strength and mobility.  Demonstration, verbal and tactile cues throughout for technique. Pulleys: Flexion and scaption x 3 min each Seated scapular retraction and depression 10 x 5" Seated R shoulder flexion and scaption AAROM using Swiffer as UE ranger 2 x 10 each Seated R shoulder ER AAROM with cane & towel roll at side 2 x 10 - cues to keep elbow tucked at side at 90 flexion and avoid straightening elbow Supine R shoulder flexion AAROM with cane 2 x 10  Supine R shoulder ER AAROM with cane and shoulder elevated on towel roll in ~30-45 abduction/scaption x 10 - cues for proper angle of cane as well as keeping R elbow flexed to 90 Submax (<50% effort) R shoulder isometrics 10 x 5": Flexion, abduction & extension into wall standing IR & ER manually resisted seated  MANUAL THERAPY: To promote normalized muscle tension, improved flexibility, improved joint mobility, increased ROM, and reduced pain. R shoulder gentle distraction +/- CW/CCW oscillations to promote muscle relaxation STM/DTM and manual TRP to R lateral detoids Gentle R shoulder PROM to tolerance in all planes   11/28/22 - Eval THERAPEUTIC EXERCISE: to improve flexibility, strength and mobility.  Demonstration, verbal and tactile cues throughout for technique. Seated scapular retraction and depression 10 x 5" Seated R shoulder flexion and scaption table slides - pt reporting she thinks her table will be too high for this at home Seated R shoulder flexion and scaption AAROM using Swiffer as UE ranger x 10 each Supine R shoulder flexion AAROM with cane x 5  Supine R shoulder ER AAROM with cane and shoulder elevated on towel roll in ~30-45 abduction/scaption x 10 - cues for proper angle of cane as well as keeping R elbow flexed to 90 Seated R shoulder ER AAROM with cane x 10 - cues to keep elbow tucked at side at 90 flexion and avoid  straightening elbow   PATIENT EDUCATION:  Education details: HEP review and HEP progression - shoulder pulleys and isometrics   Person educated: Patient Education method: Explanation, Demonstration, Tactile cues, Verbal cues, and Handouts Education comprehension: verbalized understanding, returned demonstration, verbal cues required, tactile cues required, and needs further education  HOME EXERCISE PROGRAM: Access Code: [email or web address] URL: [email or web address] Date: 12/04/2022 Prepared by: Glenetta Hew  Exercises - Seated Scapular Retraction  - 3 x daily - 7 x weekly - 2 sets - 10 reps - 5 sec hold - Seated Shoulder Flexion Extension AAROM with Dowel into Wall (Mirrored)  - 2 x daily - 7 x weekly - 2 sets - 10 reps - 3 sec hold - Supine Shoulder Flexion AAROM with Dowel (Mirrored)  - 2 x daily - 7 x weekly - 2 sets - 10 reps - 3 sec hold - Supine Shoulder External Rotation in 45 Degrees Abduction AAROM with Dowel (Mirrored)  - 2 x daily - 7 x weekly - 2 sets - 10 reps - 3 sec hold - Seated Shoulder External Rotation AAROM with Cane and Hand in Neutral  - 2 x daily - 7 x weekly - 2 sets - 10 reps - 3 sec hold - Isometric Shoulder Flexion at Wall (Mirrored)  - 1-2 x daily - 7 x weekly - 2 sets - 10 reps - 5 sec hold - Isometric Shoulder Abduction at Wall (Mirrored)  - 1-2 x daily - 7 x weekly -  2 sets - 10 reps - 5 sec hold - Isometric Shoulder Extension at Wall (Mirrored)  - 1-2 x daily - 7 x weekly - 2 sets - 10 reps - 5 sec hold - Isometric Shoulder Internal Rotation (Mirrored)  - 1-2 x daily - 7 x weekly - 2 sets - 10 reps - 5 sec hold - Isometric Shoulder External Rotation (Mirrored)  - 1-2 x daily - 7 x weekly - 2 sets - 10 reps - 5 sec hold - Seated Shoulder Flexion AAROM with Pulley Behind  - 1-2 x daily - 7 x weekly - 2 sets - 10 reps - 3 sec hold - Seated Shoulder Scaption AAROM with Pulley at Side  - 1-2 x daily - 7 x weekly - 2 sets - 10 reps - 3 sec  hold   ASSESSMENT:  CLINICAL IMPRESSION: Denine reports some increased pain today and has noted some limited tolerance with HEP due to feeling of muscle cramping. HEP reviewed, providing clarification as necessary and cautioned her not to try and force motion into painful ROM. She reports improving ROM with wand flexion AAROM and states she was able to use the AAROM with the Swiffer to help her put on a oversized T-shirt to sleep in for the first time in months. Progressed therapeutic exercises with the introduction of submax effort isometric strengthening with good tolerance. HEP progressed to include isometrics and pulley AAROM. Chariya will benefit from continued skilled PT to address ongoing R shoulder ROM and strength deficits to improve mobility and activity tolerance with decreased pain interference.   OBJECTIVE IMPAIRMENTS: decreased activity tolerance, decreased knowledge of condition, decreased mobility, decreased ROM, decreased strength, increased fascial restrictions, impaired perceived functional ability, increased muscle spasms, impaired flexibility, impaired UE functional use, improper body mechanics, postural dysfunction, and pain.   ACTIVITY LIMITATIONS: carrying, lifting, sleeping, bed mobility, bathing, toileting, dressing, self feeding, reach over head, hygiene/grooming, and caring for others  PARTICIPATION LIMITATIONS: meal prep, cleaning, laundry, driving, shopping, community activity, and yard work  PERSONAL FACTORS: Age, Fitness, Past/current experiences, Time since onset of injury/illness/exacerbation, and 3+ comorbidities: Ischemic CVA 06/2020; HTN; prediabetes; CKD-stage 3; anxiety; GERD  are also affecting patient's functional outcome.   REHAB POTENTIAL: Good  CLINICAL DECISION MAKING: Evolving/moderate complexity  EVALUATION COMPLEXITY: Moderate   GOALS: Goals reviewed with patient? Yes  SHORT TERM GOALS: Target date: 12/26/2022  Patient will be independent with  initial HEP to improve outcomes and carryover.  Baseline: Initial HEP for AAROM provided on eval Goal status: IN PROGRESS  LONG TERM GOALS: Target date: 01/23/2023  Patient will be independent with ongoing/advanced HEP for self-management at home.  Baseline:  Goal status: IN PROGRESS  2.  Patient will report 50-75% improvement in R shoulder pain to improve QOL.  Baseline: Up to 6-7/10 Goal status: IN PROGRESS  3.  Patient to demonstrate improved upright posture with posterior shoulder girdle engaged to promote improved glenohumeral joint mobility. Baseline: Forward head and rounded shoulders with mildly protracted right shoulder Goal status: IN PROGRESS  4.  Patient to improve R shoulder AROM to [facility] without pain provocation to allow for increased ease of ADLs.  Baseline: Refer to above UE ROM table Goal status: IN PROGRESS  5.  Patient will demonstrate improved R shoulder strength to >/= 4/5 in available ROM for functional UE use. Baseline: Refer to above UE MMT table Goal status: IN PROGRESS  6  Patient will report </= 40% on QuickDASH to demonstrate improved functional ability.  Baseline:  50.0 / 100 = 50.0 % Goal status: IN PROGRESS  7.  Patient will report ability to don/doff shirt over her head without limitation due to R shoulder pain or LOM. Baseline: Patient unable to wear a T-shirt that she has to put over her head. Goal status: IN PROGRESS  12/04/22 - Pt able to use AAROM techniques to help don oversized T-shirt  8.  Patient will report ability to wash her hair without need for her husband's assistance. Baseline: Patient's husband currently helping her to wash her hair. Goal status: IN PROGRESS   PLAN:  PT FREQUENCY: 1-2x/week - Patient preferring to start with 1x/wk frequency due to transportation issues and discomfort being in open spaces  PT DURATION: 8 weeks  PLANNED INTERVENTIONS: Therapeutic exercises, Therapeutic activity, Patient/Family education, Self  Care, Joint mobilization, Aquatic Therapy, Dry Needling, Electrical stimulation, Cryotherapy, Moist heat, Taping, Vasopneumatic device, Ultrasound, Ionotophoresis 4mg /ml Dexamethasone, Manual therapy, and Re-evaluation  PLAN FOR NEXT SESSION:  *Pt prefers to remain in a individual treatment room with lights dimmed whenever possible* Review initial HEP; gentle R shoulder AAROM; introduce R shoulder gentle isometrics; light postural strengthening   Marry Guan, PT 12/04/2022, 12:05 PM

## 2022-12-12 ENCOUNTER — Ambulatory Visit: Payer: Medicare Other | Admitting: Physical Therapy

## 2022-12-18 ENCOUNTER — Encounter: Payer: Self-pay | Admitting: Physical Therapy

## 2022-12-18 ENCOUNTER — Ambulatory Visit: Payer: Medicare Other | Admitting: Physical Therapy

## 2022-12-18 DIAGNOSIS — M6281 Muscle weakness (generalized): Secondary | ICD-10-CM

## 2022-12-18 DIAGNOSIS — M25611 Stiffness of right shoulder, not elsewhere classified: Secondary | ICD-10-CM | POA: Diagnosis not present

## 2022-12-18 DIAGNOSIS — R293 Abnormal posture: Secondary | ICD-10-CM

## 2022-12-18 DIAGNOSIS — M25511 Pain in right shoulder: Secondary | ICD-10-CM

## 2022-12-18 NOTE — Therapy (Signed)
OUTPATIENT PHYSICAL THERAPY TREATMENT   Patient Name: Monica Rivers MRN: [id number] DOB:April 01, 1945, 77 y.o., female Today's Date: 12/18/2022   END OF SESSION:  PT End of Session - 12/18/22 1315     Visit Number 3    Date for PT Re-Evaluation 01/23/23    Authorization Type Medicare & [employer]    PT Start Time 1315    PT Stop Time 1402    PT Time Calculation (min) 47 min    Activity Tolerance Patient tolerated treatment well;Patient limited by pain    Behavior During Therapy [facility] for tasks assessed/performed               Past Medical History:  Diagnosis Date   Acute ischemic stroke (HCC) 07/19/2020   Anxiety 08/05/2015   CKD (chronic kidney disease) stage 3, GFR 30-59 ml/min (HCC) 08/14/2016   GERD (gastroesophageal reflux disease) 01/31/2016   Hypertension 08/05/2015   Pre-diabetes 08/05/2015   Vitamin D insufficiency 08/28/2016   History reviewed. No pertinent surgical history. 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 02/20/2022   Acute ischemic stroke (HCC) 07/19/2020   Statin medication declined by patient 01/05/2020   Vitamin D insufficiency 08/28/2016   CKD (chronic kidney disease) stage 3, GFR 30-59 ml/min (HCC) 08/14/2016   GERD (gastroesophageal reflux disease) 01/31/2016   Anxiety 08/05/2015   Hypertension 08/05/2015   Pre-diabetes 08/05/2015    PCP: Eather Colas, FNP   REFERRING PROVIDER: Colvin Caroli, PA-C  REFERRING DIAG: [phone] (ICD-10-CM) - Unspecified fracture of upper end of right humerus, subsequent encounter for fracture with routine healing   THERAPY DIAG:  Stiffness of right  shoulder, not elsewhere classified  Acute pain of right shoulder  Muscle weakness (generalized)  Abnormal posture  RATIONALE FOR EVALUATION AND TREATMENT: Rehabilitation  ONSET DATE: 09/24/2022  NEXT MD VISIT: TBD   SUBJECTIVE:                                                                                                                                                                                                         SUBJECTIVE STATEMENT: Pt reports she missed last week due to a stomach virus and has not felt up to doing much of the HEP exercises at home.  She states she wakes up with increased pain and has to use the heating pad to help her muscles relax to get started in the morning.  EVAL: Pt reports she tripped over her dog on 09/24/2022 and fell on the tile floor landing on her L side but fracturing her R proximal humerus (not sure how she did it). No surgery but immobilized until last week. She reports she is R handed and having to learn to do things L handed sometimes has caused her more pain. Uses SPC when going out since her CVA, but does not use the cane at home.  PAIN: Are you having pain? Yes: NPRS scale: 6-8/10 Pain location: R lateral upper arm Pain description: nerve pain in chest, bony ache in arm Aggravating factors: sleeping - laying on back, esp  Relieving factors: upright sitting, heating pad  PERTINENT HISTORY:  Ischemic CVA 06/2020; HTN; prediabetes; CKD-stage 3; anxiety; GERD  PRECAUTIONS: None  RED FLAGS: None  HAND DOMINANCE: Right  WEIGHT BEARING RESTRICTIONS: No  FALLS:  Has patient fallen in last 6 months? Yes. 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 Also be able to feed herself easier and run the vacuum."   OBJECTIVE: (objective measures completed at initial evaluation unless otherwise dated)  DIAGNOSTIC FINDINGS:  11/22/22 - R shoulder x-ray: reviewed/interpreted ap/outlet views of right shoulder showing healing proximal humerus fracture in good position - recommend physical therapy for the next 4 weeks then transition to HEP - pt in agreement - may d/c sling f/u in 4 weeks - for recheck if needed   10/25/22 - R shoulder x-ray: ap/outlet views of right shoulder showing healing greater tuberosity fracture in good position and placement - recommend gentle physical therapy to work on rom and isometrics - f/u in 4 weeks for recheck   PATIENT SURVEYS:  Quick Dash 50.0 / 100 = 50.0 %  COGNITION: Overall cognitive status: Within functional limits for tasks assessed     SENSATION: WFL  POSTURE: rounded shoulders and forward head  UPPER EXTREMITY ROM:   Active ROM Right eval Left eval  Shoulder flexion 77 141  Shoulder extension 34 59  Shoulder abduction 53 162 *  Shoulder adduction    Shoulder internal rotation FIR lateral buttock FIR WFL  Shoulder external rotation 30 FER unable 72 FER T4  Elbow flexion    Elbow extension    Wrist flexion    Wrist extension    Wrist ulnar deviation    Wrist radial deviation    Wrist pronation    Wrist supination    (Blank rows = not tested)  UPPER EXTREMITY MMT:  MMT Right eval Left eval  Shoulder flexion 2- 5  Shoulder extension 2- 5  Shoulder abduction 2- 5  Shoulder adduction    Shoulder internal rotation 2- 5  Shoulder external rotation 2- 4  Middle trapezius    Lower trapezius    Elbow flexion    Elbow extension    Wrist flexion    Wrist extension    Wrist ulnar deviation  Wrist radial deviation    Wrist  pronation    Wrist supination    Grip strength (lbs)    (Blank rows = not tested)  PALPATION:  Increased muscle tension and TTP in R pecs and anterolateral deltoids    TODAY'S TREATMENT:   12/18/22 THERAPEUTIC EXERCISE: to improve flexibility, strength and mobility.  Demonstration, verbal and tactile cues throughout for technique. Pulleys: Flexion and scaption x 3 min each Seated R shoulder flexion and scaption AAROM using Swiffer as UE ranger 2 x 10 each - well tolerated Seated R shoulder ER AAROM with cane & towel roll at side 2 x 10 - cues to keep elbow tucked at side at 90 flexion and avoid straightening elbow, as well as to correct posture and try to gently tuck shoulder blades back during motion Submax (<50% effort) R shoulder isometrics 10 x 5" each: Flexion, abduction & extension into wall standing IR & ER manually resisted seated in chair with back support Supine R shoulder flexion AAROM with cane 2 x 10  Supine R shoulder ER AAROM with cane and shoulder elevated on towel roll in ~30-45 abduction/scaption x 10 - cues for proper angle of cane as well as keeping R elbow flexed to 90 (deferred from HEP as unable to perform w/o increased pain) Seated scapular retraction and depression 10 x 5"  MANUAL THERAPY: To promote normalized muscle tension, improved flexibility, improved joint mobility, increased ROM, and reduced pain. STM/DTM and manual TRP to R lateral deltoids, teres group and UT R shoulder gentle distraction +/- CW/CCW oscillations to promote muscle relaxation Gentle R shoulder PROM to tolerance in all planes   12/04/22 THERAPEUTIC EXERCISE: to improve flexibility, strength and mobility.  Demonstration, verbal and tactile cues throughout for technique. Pulleys: Flexion and scaption x 3 min each Seated scapular retraction and depression 10 x 5" Seated R shoulder flexion and scaption AAROM using Swiffer as UE ranger 2 x 10 each Seated R shoulder ER AAROM with cane &  towel roll at side 2 x 10 - cues to keep elbow tucked at side at 90 flexion and avoid straightening elbow Supine R shoulder flexion AAROM with cane 2 x 10  Supine R shoulder ER AAROM with cane and shoulder elevated on towel roll in ~30-45 abduction/scaption x 10 - cues for proper angle of cane as well as keeping R elbow flexed to 90 Submax (<50% effort) R shoulder isometrics 10 x 5": Flexion, abduction & extension into wall standing IR & ER manually resisted seated  MANUAL THERAPY: To promote normalized muscle tension, improved flexibility, improved joint mobility, increased ROM, and reduced pain. R shoulder gentle distraction +/- CW/CCW oscillations to promote muscle relaxation STM/DTM and manual TRP to R lateral detoids Gentle R shoulder PROM to tolerance in all planes   11/28/22 - Eval THERAPEUTIC EXERCISE: to improve flexibility, strength and mobility.  Demonstration, verbal and tactile cues throughout for technique. Seated scapular retraction and depression 10 x 5" Seated R shoulder flexion and scaption table slides - pt reporting she thinks her table will be too high for this at home Seated R shoulder flexion and scaption AAROM using Swiffer as UE ranger x 10 each Supine R shoulder flexion AAROM with cane x 5  Supine R shoulder ER AAROM with cane and shoulder elevated on towel roll in ~30-45 abduction/scaption x 10 - cues for proper angle of cane as well as keeping R elbow flexed to 90 Seated R shoulder ER AAROM with cane x 10 - cues  to keep elbow tucked at side at 90 flexion and avoid straightening elbow   PATIENT EDUCATION:  Education details: HEP review and HEP modification - deferred supine wand ER from HEP   Person educated: Patient Education method: Explanation, Demonstration, Tactile cues, and Verbal cues Education comprehension: verbalized understanding, returned demonstration, verbal cues required, tactile cues required, and needs further education  HOME EXERCISE  PROGRAM: Access Code: [email or web address] URL: [email or web address] Date: 12/04/2022 Prepared by: Glenetta Hew  Exercises - Seated Scapular Retraction  - 3 x daily - 7 x weekly - 2 sets - 10 reps - 5 sec hold - Seated Shoulder Flexion Extension AAROM with Dowel into Wall (Mirrored)  - 2 x daily - 7 x weekly - 2 sets - 10 reps - 3 sec hold - Supine Shoulder Flexion AAROM with Dowel (Mirrored)  - 2 x daily - 7 x weekly - 2 sets - 10 reps - 3 sec hold - Supine Shoulder External Rotation in 45 Degrees Abduction AAROM with Dowel (Mirrored)  - 2 x daily - 7 x weekly - 2 sets - 10 reps - 3 sec hold - Seated Shoulder External Rotation AAROM with Cane and Hand in Neutral  - 2 x daily - 7 x weekly - 2 sets - 10 reps - 3 sec hold - Isometric Shoulder Flexion at Wall (Mirrored)  - 1-2 x daily - 7 x weekly - 2 sets - 10 reps - 5 sec hold - Isometric Shoulder Abduction at Wall (Mirrored)  - 1-2 x daily - 7 x weekly - 2 sets - 10 reps - 5 sec hold - Isometric Shoulder Extension at Wall (Mirrored)  - 1-2 x daily - 7 x weekly - 2 sets - 10 reps - 5 sec hold - Isometric Shoulder Internal Rotation (Mirrored)  - 1-2 x daily - 7 x weekly - 2 sets - 10 reps - 5 sec hold - Isometric Shoulder External Rotation (Mirrored)  - 1-2 x daily - 7 x weekly - 2 sets - 10 reps - 5 sec hold - Seated Shoulder Flexion AAROM with Pulley Behind  - 1-2 x daily - 7 x weekly - 2 sets - 10 reps - 3 sec hold - Seated Shoulder Scaption AAROM with Pulley at Side  - 1-2 x daily - 7 x weekly - 2 sets - 10 reps - 3 sec hold   ASSESSMENT:  CLINICAL IMPRESSION: Ambrosia reports increased pain limiting activity tolerance including with HEP performance. HEP reviewed, clarifying movement patterns and avoidance of forcing painful ROM with alternatives provided as needed for better tolerance. Better tolerance noted for seated exercises when seated in a chair that provided good back support as well as when cued for upright posture and scapular  engagement. Unable to find position or muscle activation that allowed for supine wand ER w/o increased pain, therefore deferred from HEP for now. Jaemarie will benefit from continued skilled PT to address ongoing R shoulder ROM and strength deficits to improve mobility and activity tolerance with decreased pain interference.   OBJECTIVE IMPAIRMENTS: decreased activity tolerance, decreased knowledge of condition, decreased mobility, decreased ROM, decreased strength, increased fascial restrictions, impaired perceived functional ability, increased muscle spasms, impaired flexibility, impaired UE functional use, improper body mechanics, postural dysfunction, and pain.   ACTIVITY LIMITATIONS: carrying, lifting, sleeping, bed mobility, bathing, toileting, dressing, self feeding, reach over head, hygiene/grooming, and caring for others  PARTICIPATION LIMITATIONS: meal prep, cleaning, laundry, driving, shopping, community activity, and yard work  PERSONAL FACTORS: Age, Fitness, Past/current  experiences, Time since onset of injury/illness/exacerbation, and 3+ comorbidities: Ischemic CVA 06/2020; HTN; prediabetes; CKD-stage 3; anxiety; GERD  are also affecting patient's functional outcome.   REHAB POTENTIAL: Good  CLINICAL DECISION MAKING: Evolving/moderate complexity  EVALUATION COMPLEXITY: Moderate   GOALS: Goals reviewed with patient? Yes  SHORT TERM GOALS: Target date: 12/26/2022  Patient will be independent with initial HEP to improve outcomes and carryover.  Baseline: Initial HEP for AAROM provided on eval Goal status: IN PROGRESS  LONG TERM GOALS: Target date: 01/23/2023  Patient will be independent with ongoing/advanced HEP for self-management at home.  Baseline:  Goal status: IN PROGRESS  2.  Patient will report 50-75% improvement in R shoulder pain to improve QOL.  Baseline: Up to 6-7/10 Goal status: IN PROGRESS  3.  Patient to demonstrate improved upright posture with posterior  shoulder girdle engaged to promote improved glenohumeral joint mobility. Baseline: Forward head and rounded shoulders with mildly protracted right shoulder Goal status: IN PROGRESS  4.  Patient to improve R shoulder AROM to [facility] without pain provocation to allow for increased ease of ADLs.  Baseline: Refer to above UE ROM table Goal status: IN PROGRESS  5.  Patient will demonstrate improved R shoulder strength to >/= 4/5 in available ROM for functional UE use. Baseline: Refer to above UE MMT table Goal status: IN PROGRESS  6  Patient will report </= 40% on QuickDASH to demonstrate improved functional ability.  Baseline: 50.0 / 100 = 50.0 % Goal status: IN PROGRESS  7.  Patient will report ability to don/doff shirt over her head without limitation due to R shoulder pain or LOM. Baseline: Patient unable to wear a T-shirt that she has to put over her head. Goal status: IN PROGRESS  12/04/22 - Pt able to use AAROM techniques to help don oversized T-shirt  8.  Patient will report ability to wash her hair without need for her husband's assistance. Baseline: Patient's husband currently helping her to wash her hair. Goal status: IN PROGRESS   PLAN:  PT FREQUENCY: 1-2x/week - Patient preferring to start with 1x/wk frequency due to transportation issues and discomfort being in open spaces  PT DURATION: 8 weeks  PLANNED INTERVENTIONS: Therapeutic exercises, Therapeutic activity, Patient/Family education, Self Care, Joint mobilization, Aquatic Therapy, Dry Needling, Electrical stimulation, Cryotherapy, Moist heat, Taping, Vasopneumatic device, Ultrasound, Ionotophoresis 4mg /ml Dexamethasone, Manual therapy, and Re-evaluation  PLAN FOR NEXT SESSION:  *Pt prefers to remain in a individual treatment room with lights dimmed whenever possible* Review initial HEP; gentle R shoulder AAROM; R shoulder gentle isometrics; light postural strengthening - try gravity or YTB resisted scapular  strengthening   Marry Guan, PT 12/18/2022, 2:06 PM

## 2022-12-26 ENCOUNTER — Encounter: Payer: Self-pay | Admitting: Physical Therapy

## 2022-12-26 ENCOUNTER — Ambulatory Visit: Payer: Medicare Other | Attending: Physician Assistant | Admitting: Physical Therapy

## 2022-12-26 DIAGNOSIS — M6281 Muscle weakness (generalized): Secondary | ICD-10-CM | POA: Diagnosis present

## 2022-12-26 DIAGNOSIS — M25611 Stiffness of right shoulder, not elsewhere classified: Secondary | ICD-10-CM | POA: Diagnosis present

## 2022-12-26 DIAGNOSIS — R293 Abnormal posture: Secondary | ICD-10-CM | POA: Insufficient documentation

## 2022-12-26 DIAGNOSIS — M25511 Pain in right shoulder: Secondary | ICD-10-CM | POA: Diagnosis present

## 2022-12-26 NOTE — Therapy (Signed)
OUTPATIENT PHYSICAL THERAPY TREATMENT   Patient Name: Monica Rivers MRN: [id number] DOB:Mar 26, 1946, 77 y.o., female Today's Date: 12/26/2022   END OF SESSION:  PT End of Session - 12/26/22 1538     Visit Number 4    Date for PT Re-Evaluation 01/23/23    Authorization Type Medicare & [employer]    PT Start Time 1538    PT Stop Time 1617    PT Time Calculation (min) 39 min    Activity Tolerance Patient tolerated treatment well    Behavior During Therapy [facility] for tasks assessed/performed               Past Medical History:  Diagnosis Date   Acute ischemic stroke (HCC) 07/19/2020   Anxiety 08/05/2015   CKD (chronic kidney disease) stage 3, GFR 30-59 ml/min (HCC) 08/14/2016   GERD (gastroesophageal reflux disease) 01/31/2016   Hypertension 08/05/2015   Pre-diabetes 08/05/2015   Vitamin D insufficiency 08/28/2016   History reviewed. No pertinent surgical history. 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 02/20/2022   Acute ischemic stroke (HCC) 07/19/2020   Statin medication declined by patient 01/05/2020   Vitamin D insufficiency 08/28/2016   CKD (chronic kidney disease) stage 3, GFR 30-59 ml/min (HCC) 08/14/2016   GERD (gastroesophageal reflux disease) 01/31/2016   Anxiety 08/05/2015   Hypertension 08/05/2015   Pre-diabetes 08/05/2015    PCP: Eather Colas, FNP   REFERRING PROVIDER: Colvin Caroli, PA-C  REFERRING DIAG: [id number] (ICD-10-CM) - Unspecified fracture of upper end of right humerus, subsequent encounter for fracture with routine healing   THERAPY DIAG:  Stiffness of right shoulder, not elsewhere  classified  Acute pain of right shoulder  Muscle weakness (generalized)  Abnormal posture  RATIONALE FOR EVALUATION AND TREATMENT: Rehabilitation  ONSET DATE: 09/24/2022  NEXT MD VISIT: TBD   SUBJECTIVE:                                                                                                                                                                                                         SUBJECTIVE STATEMENT: Pt reports her pain is typically worst at night or in the morning but goes away by mid afternoon except with certain motions. She is still using the  heating pad to help her muscles relax to get started in the morning.  EVAL: Pt reports she tripped over her dog on 09/24/2022 and fell on the tile floor landing on her L side but fracturing her R proximal humerus (not sure how she did it). No surgery but immobilized until last week. She reports she is R handed and having to learn to do things L handed sometimes has caused her more pain. Uses SPC when going out since her CVA, but does not use the cane at home.  PAIN: Are you having pain? Yes: NPRS scale: 0/10 at rest, with motion 5/10 Pain location: R lateral upper arm Pain description: nerve pain in chest, bony ache in arm Aggravating factors: sleeping - laying on back, esp  Relieving factors: upright sitting, heating pad  PERTINENT HISTORY:  Ischemic CVA 06/2020; HTN; prediabetes; CKD-stage 3; anxiety; GERD  PRECAUTIONS: None  RED FLAGS: None  HAND DOMINANCE: Right  WEIGHT BEARING RESTRICTIONS: No  FALLS:  Has patient fallen in last 6 months? Yes. 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 Also be able to feed herself easier and run the vacuum."   OBJECTIVE: (objective measures completed at initial evaluation unless otherwise dated)  DIAGNOSTIC FINDINGS:  11/22/22 - R shoulder x-ray: reviewed/interpreted ap/outlet views of right shoulder showing healing proximal humerus fracture in good position - recommend physical therapy for the next 4 weeks then transition to HEP - pt in agreement - may d/c sling f/u in 4 weeks - for recheck if needed   10/25/22 - R shoulder x-ray: ap/outlet views of right shoulder showing healing greater tuberosity fracture in good position and placement - recommend gentle physical therapy to work on rom and isometrics - f/u in 4 weeks for recheck   PATIENT SURVEYS:  Quick Dash 50.0 / 100 = 50.0 %  COGNITION: Overall cognitive status: Within functional limits for tasks assessed     SENSATION: WFL  POSTURE: rounded shoulders and forward head  UPPER EXTREMITY ROM:   Active ROM Right eval Left eval  Shoulder flexion 77 141  Shoulder extension 34 59  Shoulder abduction 53 162 *  Shoulder adduction    Shoulder internal rotation FIR lateral buttock FIR WFL  Shoulder external rotation 30 FER unable 72 FER T4  Elbow flexion    Elbow extension    Wrist flexion    Wrist extension    Wrist ulnar deviation    Wrist radial deviation    Wrist pronation    Wrist supination    (Blank rows = not tested)  UPPER EXTREMITY MMT:  MMT Right eval Left eval  Shoulder flexion 2- 5  Shoulder extension 2- 5  Shoulder abduction 2- 5  Shoulder adduction    Shoulder internal rotation 2- 5  Shoulder external rotation 2- 4  Middle trapezius    Lower trapezius    Elbow flexion    Elbow extension    Wrist flexion    Wrist extension    Wrist ulnar deviation    Wrist radial deviation  Wrist pronation    Wrist supination    Grip  strength (lbs)    (Blank rows = not tested)  PALPATION:  Increased muscle tension and TTP in R pecs and anterolateral deltoids    TODAY'S TREATMENT:   12/26/22 THERAPEUTIC EXERCISE: to improve flexibility, strength and mobility.  Demonstration, verbal and tactile cues throughout for technique. Pulleys: Flexion and scaption x 3 min each Seated B scap retraction + shoulder row 10 x 3", 2 sets - 1st set with YTB, 2nd set with RTB - good tolerance with RTB therefore recommended for home Seated B scap retraction + shoulder extension 10 x 3", 2 sets - 1st set with YTB, 2nd set with RTB - more difficulty with scapular engagement with RTB, therefore recommended YTB for home Seated R bent over row 2 x 10, 2nd set with 1# db Seated R bent over shoulder extension 2 x 10, 2nd set with 1# db Standing R shoulder flexion wall slide x 10   12/18/22 THERAPEUTIC EXERCISE: to improve flexibility, strength and mobility.  Demonstration, verbal and tactile cues throughout for technique. Pulleys: Flexion and scaption x 3 min each Seated R shoulder flexion and scaption AAROM using Swiffer as UE ranger 2 x 10 each - well tolerated Seated R shoulder ER AAROM with cane & towel roll at side 2 x 10 - cues to keep elbow tucked at side at 90 flexion and avoid straightening elbow, as well as to correct posture and try to gently tuck shoulder blades back during motion Submax (<50% effort) R shoulder isometrics 10 x 5" each: Flexion, abduction & extension into wall standing IR & ER manually resisted seated in chair with back support Supine R shoulder flexion AAROM with cane 2 x 10  Supine R shoulder ER AAROM with cane and shoulder elevated on towel roll in ~30-45 abduction/scaption x 10 - cues for proper angle of cane as well as keeping R elbow flexed to 90 (deferred from HEP as unable to perform w/o increased pain) Seated scapular retraction and depression 10 x 5"  MANUAL THERAPY: To promote normalized muscle  tension, improved flexibility, improved joint mobility, increased ROM, and reduced pain. STM/DTM and manual TRP to R lateral deltoids, teres group and UT R shoulder gentle distraction +/- CW/CCW oscillations to promote muscle relaxation Gentle R shoulder PROM to tolerance in all planes   12/04/22 THERAPEUTIC EXERCISE: to improve flexibility, strength and mobility.  Demonstration, verbal and tactile cues throughout for technique. Pulleys: Flexion and scaption x 3 min each Seated scapular retraction and depression 10 x 5" Seated R shoulder flexion and scaption AAROM using Swiffer as UE ranger 2 x 10 each Seated R shoulder ER AAROM with cane & towel roll at side 2 x 10 - cues to keep elbow tucked at side at 90 flexion and avoid straightening elbow Supine R shoulder flexion AAROM with cane 2 x 10  Supine R shoulder ER AAROM with cane and shoulder elevated on towel roll in ~30-45 abduction/scaption x 10 - cues for proper angle of cane as well as keeping R elbow flexed to 90 Submax (<50% effort) R shoulder isometrics 10 x 5": Flexion, abduction & extension into wall standing IR & ER manually resisted seated  MANUAL THERAPY: To promote normalized muscle tension, improved flexibility, improved joint mobility, increased ROM, and reduced pain. R shoulder gentle distraction +/- CW/CCW oscillations to promote muscle relaxation STM/DTM and manual TRP to R lateral detoids Gentle R shoulder PROM to tolerance in all planes  11/28/22 - Eval THERAPEUTIC EXERCISE: to improve flexibility, strength and mobility.  Demonstration, verbal and tactile cues throughout for technique. Seated scapular retraction and depression 10 x 5" Seated R shoulder flexion and scaption table slides - pt reporting she thinks her table will be too high for this at home Seated R shoulder flexion and scaption AAROM using Swiffer as UE ranger x 10 each Supine R shoulder flexion AAROM with cane x 5  Supine R shoulder ER AAROM with  cane and shoulder elevated on towel roll in ~30-45 abduction/scaption x 10 - cues for proper angle of cane as well as keeping R elbow flexed to 90 Seated R shoulder ER AAROM with cane x 10 - cues to keep elbow tucked at side at 90 flexion and avoid straightening elbow   PATIENT EDUCATION:  Education details: HEP progression - R shoulder flexion and scaption wall slides, scapular strengthening   Person educated: Patient Education method: Explanation, Demonstration, Tactile cues, Verbal cues, and Handouts Education comprehension: verbalized understanding, returned demonstration, verbal cues required, tactile cues required, and needs further education  HOME EXERCISE PROGRAM: Access Code: [email or web address] URL: [email or web address] Date: 12/26/2022 Prepared by: Glenetta Hew  Exercises - Seated Scapular Retraction  - 3 x daily - 7 x weekly - 2 sets - 10 reps - 5 sec hold - Seated Shoulder Flexion Extension AAROM with Dowel into Wall (Mirrored)  - 2 x daily - 7 x weekly - 2 sets - 10 reps - 3 sec hold - Supine Shoulder Flexion AAROM with Dowel (Mirrored)  - 1 x daily - 7 x weekly - 2 sets - 10 reps - 3 sec hold - Supine Shoulder External Rotation in 45 Degrees Abduction AAROM with Dowel (Mirrored)  - 1 x daily - 7 x weekly - 2 sets - 10 reps - 3 sec hold - Seated Shoulder External Rotation AAROM with Cane and Hand in Neutral  - 1 x daily - 7 x weekly - 2 sets - 10 reps - 3 sec hold - Isometric Shoulder Flexion at Wall (Mirrored)  - 1-2 x daily - 7 x weekly - 2 sets - 10 reps - 5 sec hold - Isometric Shoulder Abduction at Wall (Mirrored)  - 1-2 x daily - 7 x weekly - 2 sets - 10 reps - 5 sec hold - Isometric Shoulder Extension at Wall (Mirrored)  - 1-2 x daily - 7 x weekly - 2 sets - 10 reps - 5 sec hold - Isometric Shoulder Internal Rotation (Mirrored)  - 1-2 x daily - 7 x weekly - 2 sets - 10 reps - 5 sec hold - Isometric Shoulder External Rotation (Mirrored)  - 1-2 x daily - 7 x weekly  - 2 sets - 10 reps - 5 sec hold - Seated Shoulder Flexion AAROM with Pulley Behind  - 1-2 x daily - 7 x weekly - 2 sets - 10 reps - 3 sec hold - Seated Shoulder Scaption AAROM with Pulley at Side  - 1-2 x daily - 7 x weekly - 2 sets - 10 reps - 3 sec hold - Seated Shoulder Row with Anchored Resistance  - 1 x daily - 3 x weekly - 2 sets - 10 reps - 3-5 hold hold - Seated Shoulder Extension and Scapular Retraction with Resistance  - 1 x daily - 3 x weekly - 2 sets - 10 reps - 3-5 sec hold - Seated Single Arm Bent Over Shoulder Row with Dumbbell  - 1 x daily - 3  x weekly - 2 sets - 10 reps - 3 sec hold - Seated Single Arm Shoulder Extension with Dumbbell  - 1 x daily - 3 x weekly - 2 sets - 10 reps - 3 sec hold - Shoulder Flexion Wall Slide with Towel  - 1 x daily - 7 x weekly - 2 sets - 10 reps - 3 sec hold - Scaption Wall Slide with Towel  - 1 x daily - 7 x weekly - 2 sets - 10 reps - 3 sec hold   ASSESSMENT:  CLINICAL IMPRESSION: Monica Rivers reports intermittent ongoing R shoulder pain, typically worse at night and first thing in the morning and subsiding as she is more active during the day but still catching her off guard at times when she tries to use her arm in certain movement patterns.  She reports she is comfortable with her current initial HEP and denies need for further review - STG #1 met.  Progressed AA/AROM with introduction of wall slides for R shoulder flexion and scaption and initiated resisted scapular/postural strengthening with Thera-Band and gravity resisted scapular retraction exercises.  HEP updated to reflect exercise progression.  Monica Rivers will benefit from continued skilled PT to address ongoing R shoulder ROM and strength deficits to improve mobility and activity tolerance with decreased pain interference.   OBJECTIVE IMPAIRMENTS: decreased activity tolerance, decreased knowledge of condition, decreased mobility, decreased ROM, decreased strength, increased fascial restrictions,  impaired perceived functional ability, increased muscle spasms, impaired flexibility, impaired UE functional use, improper body mechanics, postural dysfunction, and pain.   ACTIVITY LIMITATIONS: carrying, lifting, sleeping, bed mobility, bathing, toileting, dressing, self feeding, reach over head, hygiene/grooming, and caring for others  PARTICIPATION LIMITATIONS: meal prep, cleaning, laundry, driving, shopping, community activity, and yard work  PERSONAL FACTORS: Age, Fitness, Past/current experiences, Time since onset of injury/illness/exacerbation, and 3+ comorbidities: Ischemic CVA 06/2020; HTN; prediabetes; CKD-stage 3; anxiety; GERD  are also affecting patient's functional outcome.   REHAB POTENTIAL: Good  CLINICAL DECISION MAKING: Evolving/moderate complexity  EVALUATION COMPLEXITY: Moderate   GOALS: Goals reviewed with patient? Yes  SHORT TERM GOALS: Target date: 12/26/2022  Patient will be independent with initial HEP to improve outcomes and carryover.  Baseline: Initial HEP for AAROM provided on eval Goal status: MET  12/26/22  LONG TERM GOALS: Target date: 01/23/2023  Patient will be independent with ongoing/advanced HEP for self-management at home.  Baseline:  Goal status: IN PROGRESS  2.  Patient will report 50-75% improvement in R shoulder pain to improve QOL.  Baseline: Up to 6-7/10 Goal status: IN PROGRESS  3.  Patient to demonstrate improved upright posture with posterior shoulder girdle engaged to promote improved glenohumeral joint mobility. Baseline: Forward head and rounded shoulders with mildly protracted right shoulder Goal status: IN PROGRESS  4.  Patient to improve R shoulder AROM to [facility] without pain provocation to allow for increased ease of ADLs.  Baseline: Refer to above UE ROM table Goal status: IN PROGRESS  5.  Patient will demonstrate improved R shoulder strength to >/= 4/5 in available ROM for functional UE use. Baseline: Refer to above UE MMT  table Goal status: IN PROGRESS  6  Patient will report </= 40% on QuickDASH to demonstrate improved functional ability.  Baseline: 50.0 / 100 = 50.0 % Goal status: IN PROGRESS  7.  Patient will report ability to don/doff shirt over her head without limitation due to R shoulder pain or LOM. Baseline: Patient unable to wear a T-shirt that she has to put  over her head. Goal status: IN PROGRESS  12/04/22 - Pt able to use AAROM techniques to help don oversized T-shirt  8.  Patient will report ability to wash her hair without need for her husband's assistance. Baseline: Patient's husband currently helping her to wash her hair. Goal status: IN PROGRESS   PLAN:  PT FREQUENCY: 1-2x/week - Patient preferring to start with 1x/wk frequency due to transportation issues and discomfort being in open spaces  PT DURATION: 8 weeks  PLANNED INTERVENTIONS: Therapeutic exercises, Therapeutic activity, Patient/Family education, Self Care, Joint mobilization, Aquatic Therapy, Dry Needling, Electrical stimulation, Cryotherapy, Moist heat, Taping, Vasopneumatic device, Ultrasound, Ionotophoresis 4mg /ml Dexamethasone, Manual therapy, and Re-evaluation  PLAN FOR NEXT SESSION:  *Pt prefers to remain in a individual treatment room with lights dimmed whenever possible* Reassess R shoulder ROM; gentle R shoulder AA/AROM; R shoulder gentle isometrics progressing to reactive isometrics as tolerated; light postural strengthening; review/update HEP as indicated  Marry Guan, PT 12/26/2022, 3:41 PM

## 2023-01-02 ENCOUNTER — Encounter: Payer: Self-pay | Admitting: Physical Therapy

## 2023-01-02 ENCOUNTER — Ambulatory Visit: Payer: Medicare Other | Admitting: Physical Therapy

## 2023-01-02 DIAGNOSIS — M25611 Stiffness of right shoulder, not elsewhere classified: Secondary | ICD-10-CM | POA: Diagnosis not present

## 2023-01-02 DIAGNOSIS — M25511 Pain in right shoulder: Secondary | ICD-10-CM

## 2023-01-02 DIAGNOSIS — M6281 Muscle weakness (generalized): Secondary | ICD-10-CM

## 2023-01-02 DIAGNOSIS — R293 Abnormal posture: Secondary | ICD-10-CM

## 2023-01-02 NOTE — Therapy (Signed)
OUTPATIENT PHYSICAL THERAPY TREATMENT   Patient Name: Monica Rivers MRN: [id number] DOB:06/15/1945, 77 y.o., female Today's Date: 01/02/2023   END OF SESSION:  PT End of Session - 01/02/23 1314     Visit Number 5    Date for PT Re-Evaluation 01/23/23    Authorization Type Medicare & [employer]    PT Start Time 1314    PT Stop Time 1400    PT Time Calculation (min) 46 min    Activity Tolerance Patient tolerated treatment well    Behavior During Therapy [facility] for tasks assessed/performed                Past Medical History:  Diagnosis Date   Acute ischemic stroke (HCC) 07/19/2020   Anxiety 08/05/2015   CKD (chronic kidney disease) stage 3, GFR 30-59 ml/min (HCC) 08/14/2016   GERD (gastroesophageal reflux disease) 01/31/2016   Hypertension 08/05/2015   Pre-diabetes 08/05/2015   Vitamin D insufficiency 08/28/2016   History reviewed. No pertinent surgical history. 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 02/20/2022   Acute ischemic stroke (HCC) 07/19/2020   Statin medication declined by patient 01/05/2020   Vitamin D insufficiency 08/28/2016   CKD (chronic kidney disease) stage 3, GFR 30-59 ml/min (HCC) 08/14/2016   GERD (gastroesophageal reflux disease) 01/31/2016   Anxiety 08/05/2015   Hypertension 08/05/2015   Pre-diabetes 08/05/2015    PCP: Eather Colas, FNP   REFERRING PROVIDER: Colvin Caroli, PA-C  REFERRING DIAG: [phone] (ICD-10-CM) - Unspecified fracture of upper end of right humerus, subsequent encounter for fracture with routine healing   THERAPY DIAG:  Stiffness of right shoulder, not elsewhere  classified  Acute pain of right shoulder  Muscle weakness (generalized)  Abnormal posture  RATIONALE FOR EVALUATION AND TREATMENT: Rehabilitation  ONSET DATE: 09/24/2022  NEXT MD VISIT: None scheduled   SUBJECTIVE:                                                                                                                                                                                                         SUBJECTIVE STATEMENT: Pt reports her pain still is typically worst at night or in the morning but goes away by mid afternoon except with certain motions. She thinks  this is because her arm/shoulder get stiff while she is sleeping. She notes increased muscle soreness with the new scapular strengthening exercises.  EVAL: Pt reports she tripped over her dog on 09/24/2022 and fell on the tile floor landing on her L side but fracturing her R proximal humerus (not sure how she did it). No surgery but immobilized until last week. She reports she is R handed and having to learn to do things L handed sometimes has caused her more pain. Uses SPC when going out since her CVA, but does not use the cane at home.  PAIN: Are you having pain? Yes: NPRS scale:  4/10 Pain location: R lateral upper arm/shoulder Pain description: nerve pain in chest, bony ache in arm Aggravating factors: sleeping - laying on back, esp  Relieving factors: upright sitting, heating pad  PERTINENT HISTORY:  Ischemic CVA 06/2020; HTN; prediabetes; CKD-stage 3; anxiety; GERD  PRECAUTIONS: None  RED FLAGS: None  HAND DOMINANCE: Right  WEIGHT BEARING RESTRICTIONS: No  FALLS:  Has patient fallen in last 6 months? Yes. 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 Also be able to feed herself easier and run the vacuum."   OBJECTIVE: (objective measures completed at initial evaluation unless otherwise dated)  DIAGNOSTIC FINDINGS:  11/22/22 - R shoulder x-ray: reviewed/interpreted ap/outlet views of right shoulder showing healing proximal humerus fracture in good position - recommend physical therapy for the next 4 weeks then transition to HEP - pt in agreement - may d/c sling f/u in 4 weeks - for recheck if needed   10/25/22 - R shoulder x-ray: ap/outlet views of right shoulder showing healing greater tuberosity fracture in good position and placement - recommend gentle physical therapy to work on rom and isometrics - f/u in 4 weeks for recheck   PATIENT SURVEYS:  Quick Dash 50.0 / 100 = 50.0 %  COGNITION: Overall cognitive status: Within functional limits for tasks assessed     SENSATION: WFL  POSTURE: rounded shoulders and forward head  UPPER EXTREMITY ROM:   Active ROM Right eval Left eval R 01/02/23  Shoulder flexion 77 141 86  Shoulder extension 34 59 40  Shoulder abduction 53 162 * 66  Shoulder adduction     Shoulder internal rotation FIR lateral buttock FIR WFL   Shoulder external rotation 30 FER unable 72 FER T4 40  Elbow flexion     Elbow extension     Wrist flexion     Wrist extension     Wrist ulnar deviation     Wrist radial deviation     Wrist pronation     Wrist supination     (Blank rows = not tested)  UPPER EXTREMITY MMT:  MMT Right eval Left eval  Shoulder flexion 2- 5  Shoulder extension 2- 5  Shoulder abduction 2- 5  Shoulder adduction    Shoulder internal rotation 2- 5  Shoulder external rotation 2- 4  Middle trapezius    Lower trapezius    Elbow flexion    Elbow extension    Wrist  flexion    Wrist extension     Wrist ulnar deviation    Wrist radial deviation    Wrist pronation    Wrist supination    Grip strength (lbs)    (Blank rows = not tested)  PALPATION:  Increased muscle tension and TTP in R pecs and anterolateral deltoids    TODAY'S TREATMENT:   01/02/23 THERAPEUTIC EXERCISE: to improve flexibility, strength and mobility.  Demonstration, verbal and tactile cues throughout for technique. Pulleys: Flexion and scaption x 3 min each Seated B bent over row resting on pillow in lap 2 x 10, 2nd set with 1# db bil Seated B bent over shoulder extension with torso resting on pillow in lap 2 x 10, 2nd set with 1# db Seated YTB B scap retraction + shoulder row 10 x 3", 2 sets - cues to avoid shoulder shrug and excessive shoulder extension/upward tilt of elbows Seated YTB B scap retraction + shoulder extension 10 x 3", 2 sets  Supine R shoulder flexion AAROM with cane with HOB elevated to 20 x 10 Supine R shoulder flexion AA/AROM with HOB elevated to 20 2 x 10 - PT initially guiding motion and facilitating scap retraction and depression with pt gradually taking more active control Supine R shoulder scaption AA/AROM with HOB elevated to 20 2 x 10 - PT initially guiding motion and facilitating scap retraction and depression with pt gradually taking more active control   12/26/22 THERAPEUTIC EXERCISE: to improve flexibility, strength and mobility.  Demonstration, verbal and tactile cues throughout for technique. Pulleys: Flexion and scaption x 3 min each Seated B scap retraction + shoulder row 10 x 3", 2 sets - 1st set with YTB, 2nd set with RTB - good tolerance with RTB therefore recommended for home Seated B scap retraction + shoulder extension 10 x 3", 2 sets - 1st set with YTB, 2nd set with RTB - more difficulty with scapular engagement with RTB, therefore recommended YTB for home Seated R bent over row 2 x 10, 2nd set with 1# db Seated R bent over shoulder extension 2 x 10, 2nd set with 1#  db Standing R shoulder flexion wall slide x 10   12/18/22 THERAPEUTIC EXERCISE: to improve flexibility, strength and mobility.  Demonstration, verbal and tactile cues throughout for technique. Pulleys: Flexion and scaption x 3 min each Seated R shoulder flexion and scaption AAROM using Swiffer as UE ranger 2 x 10 each - well tolerated Seated R shoulder ER AAROM with cane & towel roll at side 2 x 10 - cues to keep elbow tucked at side at 90 flexion and avoid straightening elbow, as well as to correct posture and try to gently tuck shoulder blades back during motion Submax (<50% effort) R shoulder isometrics 10 x 5" each: Flexion, abduction & extension into wall standing IR & ER manually resisted seated in chair with back support Supine R shoulder flexion AAROM with cane 2 x 10  Supine R shoulder ER AAROM with cane and shoulder elevated on towel roll in ~30-45 abduction/scaption x 10 - cues for proper angle of cane as well as keeping R elbow flexed to 90 (deferred from HEP as unable to perform w/o increased pain) Seated scapular retraction and depression 10 x 5"  MANUAL THERAPY: To promote normalized muscle tension, improved flexibility, improved joint mobility, increased ROM, and reduced pain. STM/DTM and manual TRP to R lateral deltoids, teres group and UT R shoulder gentle distraction +/- CW/CCW oscillations to promote muscle relaxation Gentle  R shoulder PROM to tolerance in all planes   PATIENT EDUCATION:  Education details: HEP progression - R shoulder flexion and scaption wall slides, scapular strengthening   Person educated: Patient Education method: Explanation, Demonstration, Tactile cues, Verbal cues, and Handouts Education comprehension: verbalized understanding, returned demonstration, verbal cues required, tactile cues required, and needs further education  HOME EXERCISE PROGRAM: Access Code: [email or web address] URL: [email or web address] Date: 12/26/2022 Prepared by:  Glenetta Hew  Exercises - Seated Scapular Retraction  - 3 x daily - 7 x weekly - 2 sets - 10 reps - 5 sec hold - Seated Shoulder Flexion Extension AAROM with Dowel into Wall (Mirrored)  - 2 x daily - 7 x weekly - 2 sets - 10 reps - 3 sec hold - Supine Shoulder Flexion AAROM with Dowel (Mirrored)  - 1 x daily - 7 x weekly - 2 sets - 10 reps - 3 sec hold - Supine Shoulder External Rotation in 45 Degrees Abduction AAROM with Dowel (Mirrored)  - 1 x daily - 7 x weekly - 2 sets - 10 reps - 3 sec hold - Seated Shoulder External Rotation AAROM with Cane and Hand in Neutral  - 1 x daily - 7 x weekly - 2 sets - 10 reps - 3 sec hold - Isometric Shoulder Flexion at Wall (Mirrored)  - 1-2 x daily - 7 x weekly - 2 sets - 10 reps - 5 sec hold - Isometric Shoulder Abduction at Wall (Mirrored)  - 1-2 x daily - 7 x weekly - 2 sets - 10 reps - 5 sec hold - Isometric Shoulder Extension at Wall (Mirrored)  - 1-2 x daily - 7 x weekly - 2 sets - 10 reps - 5 sec hold - Isometric Shoulder Internal Rotation (Mirrored)  - 1-2 x daily - 7 x weekly - 2 sets - 10 reps - 5 sec hold - Isometric Shoulder External Rotation (Mirrored)  - 1-2 x daily - 7 x weekly - 2 sets - 10 reps - 5 sec hold - Seated Shoulder Flexion AAROM with Pulley Behind  - 1-2 x daily - 7 x weekly - 2 sets - 10 reps - 3 sec hold - Seated Shoulder Scaption AAROM with Pulley at Side  - 1-2 x daily - 7 x weekly - 2 sets - 10 reps - 3 sec hold - Seated Shoulder Row with Anchored Resistance  - 1 x daily - 3 x weekly - 2 sets - 10 reps - 3-5 hold hold - Seated Shoulder Extension and Scapular Retraction with Resistance  - 1 x daily - 3 x weekly - 2 sets - 10 reps - 3-5 sec hold - Seated Single Arm Bent Over Shoulder Row with Dumbbell  - 1 x daily - 3 x weekly - 2 sets - 10 reps - 3 sec hold - Seated Single Arm Shoulder Extension with Dumbbell  - 1 x daily - 3 x weekly - 2 sets - 10 reps - 3 sec hold - Shoulder Flexion Wall Slide with Towel  - 1 x daily - 7 x  weekly - 2 sets - 10 reps - 3 sec hold - Scaption Wall Slide with Towel  - 1 x daily - 7 x weekly - 2 sets - 10 reps - 3 sec hold   ASSESSMENT:  CLINICAL IMPRESSION: Emmanuel reports increased R posterior shoulder muscle soreness after working on the bent over rows and shoulder extension at home. Reviewed these exercises, converting to B UE performance and using  pillow in lap for torso support - pt reports better tolerance/comfort with these modifications.  Cues necessary during YTB rows to avoid shrug/elevation on right.  Worked on increasing gravity influence on R shoulder AA/AROM with HOB elevated 20 in supine.  Patient requiring PT facilitation of scapular mechanics as well as initial guidance through flexion and scaption AROM in supine with HOB elevated but patient able to gradually increase active control.  R shoulder AROM slowly improving in upright positions but still severely limited.  Daveda will benefit from continued skilled PT to address ongoing R shoulder ROM and strength deficits to improve mobility and activity tolerance with decreased pain interference.   OBJECTIVE IMPAIRMENTS: decreased activity tolerance, decreased knowledge of condition, decreased mobility, decreased ROM, decreased strength, increased fascial restrictions, impaired perceived functional ability, increased muscle spasms, impaired flexibility, impaired UE functional use, improper body mechanics, postural dysfunction, and pain.   ACTIVITY LIMITATIONS: carrying, lifting, sleeping, bed mobility, bathing, toileting, dressing, self feeding, reach over head, hygiene/grooming, and caring for others  PARTICIPATION LIMITATIONS: meal prep, cleaning, laundry, driving, shopping, community activity, and yard work  PERSONAL FACTORS: Age, Fitness, Past/current experiences, Time since onset of injury/illness/exacerbation, and 3+ comorbidities: Ischemic CVA 06/2020; HTN; prediabetes; CKD-stage 3; anxiety; GERD  are also affecting  patient's functional outcome.   REHAB POTENTIAL: Good  CLINICAL DECISION MAKING: Evolving/moderate complexity  EVALUATION COMPLEXITY: Moderate   GOALS: Goals reviewed with patient? Yes  SHORT TERM GOALS: Target date: 12/26/2022  Patient will be independent with initial HEP to improve outcomes and carryover.  Baseline: Initial HEP for AAROM provided on eval Goal status: MET  12/26/22  LONG TERM GOALS: Target date: 01/23/2023  Patient will be independent with ongoing/advanced HEP for self-management at home.  Baseline:  Goal status: IN PROGRESS  2.  Patient will report 50-75% improvement in R shoulder pain to improve QOL.  Baseline: Up to 6-7/10 Goal status: IN PROGRESS  3.  Patient to demonstrate improved upright posture with posterior shoulder girdle engaged to promote improved glenohumeral joint mobility. Baseline: Forward head and rounded shoulders with mildly protracted right shoulder Goal status: IN PROGRESS  4.  Patient to improve R shoulder AROM to [facility] without pain provocation to allow for increased ease of ADLs.  Baseline: Refer to above UE ROM table Goal status: IN PROGRESS  5.  Patient will demonstrate improved R shoulder strength to >/= 4/5 in available ROM for functional UE use. Baseline: Refer to above UE MMT table Goal status: IN PROGRESS  6  Patient will report </= 40% on QuickDASH to demonstrate improved functional ability.  Baseline: 50.0 / 100 = 50.0 % Goal status: IN PROGRESS  7.  Patient will report ability to don/doff shirt over her head without limitation due to R shoulder pain or LOM. Baseline: Patient unable to wear a T-shirt that she has to put over her head. Goal status: IN PROGRESS  12/04/22 - Pt able to use AAROM techniques to help don oversized T-shirt  8.  Patient will report ability to wash her hair without need for her husband's assistance. Baseline: Patient's husband currently helping her to wash her hair. Goal status: IN  PROGRESS   PLAN:  PT FREQUENCY: 1-2x/week - Patient preferring to start with 1x/wk frequency due to transportation issues and discomfort being in open spaces  PT DURATION: 8 weeks  PLANNED INTERVENTIONS: Therapeutic exercises, Therapeutic activity, Patient/Family education, Self Care, Joint mobilization, Aquatic Therapy, Dry Needling, Electrical stimulation, Cryotherapy, Moist heat, Taping, Vasopneumatic device, Ultrasound, Ionotophoresis 4mg /ml Dexamethasone, Manual  therapy, and Re-evaluation  PLAN FOR NEXT SESSION:  *Pt prefers to remain in a individual treatment room with lights dimmed whenever possible* Gentle R shoulder AA/AROM; R shoulder gentle isometrics progressing to reactive isometrics as tolerated; light postural strengthening; review/update HEP as indicated  Marry Guan, PT 01/02/2023, 1:19 PM

## 2023-01-08 ENCOUNTER — Encounter: Payer: Self-pay | Admitting: Physical Therapy

## 2023-01-08 ENCOUNTER — Ambulatory Visit: Payer: Medicare Other | Admitting: Physical Therapy

## 2023-01-08 DIAGNOSIS — M25511 Pain in right shoulder: Secondary | ICD-10-CM

## 2023-01-08 DIAGNOSIS — M25611 Stiffness of right shoulder, not elsewhere classified: Secondary | ICD-10-CM | POA: Diagnosis not present

## 2023-01-08 DIAGNOSIS — M6281 Muscle weakness (generalized): Secondary | ICD-10-CM

## 2023-01-08 DIAGNOSIS — R293 Abnormal posture: Secondary | ICD-10-CM

## 2023-01-08 NOTE — Therapy (Signed)
OUTPATIENT PHYSICAL THERAPY TREATMENT   Patient Name: Monica Rivers MRN: [id number] DOB:1946-03-04, 77 y.o., female Today's Date: 01/08/2023   END OF SESSION:  PT End of Session - 01/08/23 1315     Visit Number 6    Date for PT Re-Evaluation 01/23/23    Authorization Type Medicare & [employer]    PT Start Time 1315    PT Stop Time 1400    PT Time Calculation (min) 45 min    Activity Tolerance Patient tolerated treatment well    Behavior During Therapy [facility] for tasks assessed/performed                 Past Medical History:  Diagnosis Date   Acute ischemic stroke (HCC) 07/19/2020   Anxiety 08/05/2015   CKD (chronic kidney disease) stage 3, GFR 30-59 ml/min (HCC) 08/14/2016   GERD (gastroesophageal reflux disease) 01/31/2016   Hypertension 08/05/2015   Pre-diabetes 08/05/2015   Vitamin D insufficiency 08/28/2016   History reviewed. No pertinent surgical history. 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 02/20/2022   Acute ischemic stroke (HCC) 07/19/2020   Statin medication declined by patient 01/05/2020   Vitamin D insufficiency 08/28/2016   CKD (chronic kidney disease) stage 3, GFR 30-59 ml/min (HCC) 08/14/2016   GERD (gastroesophageal reflux disease) 01/31/2016   Anxiety 08/05/2015   Hypertension 08/05/2015   Pre-diabetes 08/05/2015    PCP: Eather Colas, FNP   REFERRING PROVIDER: Colvin Caroli, PA-C  REFERRING DIAG: [phone] (ICD-10-CM) - Unspecified fracture of upper end of right humerus, subsequent encounter for fracture with routine healing   THERAPY DIAG:  Stiffness of right shoulder, not elsewhere  classified  Acute pain of right shoulder  Muscle weakness (generalized)  Abnormal posture  RATIONALE FOR EVALUATION AND TREATMENT: Rehabilitation  ONSET DATE: 09/24/2022  NEXT MD VISIT: None scheduled   SUBJECTIVE:                                                                                                                                                                                                         SUBJECTIVE STATEMENT: Pt reports today is not a good day after doing extracurricular things that she probably shouldn't have done this weekend and now her shoulder is  hurting more.  EVAL: Pt reports she tripped over her dog on 09/24/2022 and fell on the tile floor landing on her L side but fracturing her R proximal humerus (not sure how she did it). No surgery but immobilized until last week. She reports she is R handed and having to learn to do things L handed sometimes has caused her more pain. Uses SPC when going out since her CVA, but does not use the cane at home.  PAIN: Are you having pain? Yes: NPRS scale:  5/10 Pain location: R lateral upper arm/shoulder Pain description: nerve pain in chest, bony ache in arm Aggravating factors: sleeping - laying on back, esp  Relieving factors: upright sitting, heating pad  PERTINENT HISTORY:  Ischemic CVA 06/2020; HTN; prediabetes; CKD-stage 3; anxiety; GERD  PRECAUTIONS: None  RED FLAGS: None  HAND DOMINANCE: Right  WEIGHT BEARING RESTRICTIONS: No  FALLS:  Has patient fallen in last 6 months? Yes. 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 Also be able to feed herself easier and run the vacuum."   OBJECTIVE: (objective measures completed at initial evaluation unless otherwise dated)  DIAGNOSTIC FINDINGS:  11/22/22 - R shoulder x-ray: reviewed/interpreted ap/outlet views of right shoulder showing healing proximal humerus fracture in good position - recommend physical therapy for the next 4 weeks then transition to HEP - pt in agreement - may d/c sling f/u in 4 weeks - for recheck if needed   10/25/22 - R shoulder x-ray: ap/outlet views of right shoulder showing healing greater tuberosity fracture in good position and placement - recommend gentle physical therapy to work on rom and isometrics - f/u in 4 weeks for recheck   PATIENT SURVEYS:  Quick Dash 50.0 / 100 = 50.0 %  COGNITION: Overall cognitive status: Within functional limits for tasks assessed     SENSATION: WFL  POSTURE: rounded shoulders and forward head  UPPER EXTREMITY ROM:   Active ROM Right eval Left eval R 01/02/23  Shoulder flexion 77 141 86  Shoulder extension 34 59 40  Shoulder abduction 53 162 * 66  Shoulder adduction     Shoulder internal rotation FIR lateral buttock FIR WFL   Shoulder external rotation 30 FER unable 72 FER T4 40  Elbow flexion     Elbow extension     Wrist flexion     Wrist extension     Wrist ulnar deviation     Wrist radial deviation     Wrist pronation     Wrist supination     (Blank rows = not tested)  UPPER EXTREMITY MMT:  MMT Right eval Left eval  Shoulder flexion 2- 5  Shoulder extension 2- 5  Shoulder abduction 2- 5  Shoulder adduction    Shoulder internal rotation 2- 5  Shoulder external rotation 2- 4  Middle trapezius    Lower trapezius    Elbow flexion    Elbow extension    Wrist flexion    Wrist extension    Wrist ulnar deviation    Wrist radial deviation  Wrist pronation    Wrist supination    Grip strength (lbs)    (Blank rows = not  tested)  PALPATION:  Increased muscle tension and TTP in R pecs and anterolateral deltoids    TODAY'S TREATMENT:   01/08/23 THERAPEUTIC EXERCISE: to improve flexibility, strength and mobility.  Demonstration, verbal and tactile cues throughout for technique. Pulleys: Flexion and scaption x 3 min each Supine R shoulder flexion AA/AROM with HOB elevated to 20 2 x 10 - PT initially guiding motion and facilitating scap retraction and depression with pt gradually taking more active control Supine R shoulder scaption AA/AROM with HOB elevated to 20 2 x 10 - PT initially guiding motion and facilitating scap retraction and depression with pt gradually taking more active control Supine R shoulder protraction/retraction AA/AROM with arm vertical and HOB elevated to 20 2 x 10 - PT initially guiding motion and facilitating scap retraction and depression with pt gradually taking more active control Supine R shoulder CW/CCW circles with arm vertical and HOB elevated to 20 2 x 10 each direction Supine R shoulder rhythmic stabilization with arm vertical and HOB elevated to 20 2 x 30 sec   MANUAL THERAPY: To promote normalized muscle tension, improved flexibility, improved joint mobility, increased ROM, and reduced pain.  R shoulder gentle distraction +/- CW/CCW oscillations to promote muscle relaxation STM/DTM to R teres group and lats   01/02/23 THERAPEUTIC EXERCISE: to improve flexibility, strength and mobility.  Demonstration, verbal and tactile cues throughout for technique. Pulleys: Flexion and scaption x 3 min each Seated B bent over row resting on pillow in lap 2 x 10, 2nd set with 1# db bil Seated B bent over shoulder extension with torso resting on pillow in lap 2 x 10, 2nd set with 1# db Seated YTB B scap retraction + shoulder row 10 x 3", 2 sets - cues to avoid shoulder shrug and excessive shoulder extension/upward tilt of elbows Seated YTB B scap retraction + shoulder extension 10 x 3", 2  sets  Supine R shoulder flexion AAROM with cane with HOB elevated to 20 x 10 Supine R shoulder flexion AA/AROM with HOB elevated to 20 2 x 10 - PT initially guiding motion and facilitating scap retraction and depression with pt gradually taking more active control Supine R shoulder scaption AA/AROM with HOB elevated to 20 2 x 10 - PT initially guiding motion and facilitating scap retraction and depression with pt gradually taking more active control   12/26/22 THERAPEUTIC EXERCISE: to improve flexibility, strength and mobility.  Demonstration, verbal and tactile cues throughout for technique. Pulleys: Flexion and scaption x 3 min each Seated B scap retraction + shoulder row 10 x 3", 2 sets - 1st set with YTB, 2nd set with RTB - good tolerance with RTB therefore recommended for home Seated B scap retraction + shoulder extension 10 x 3", 2 sets - 1st set with YTB, 2nd set with RTB - more difficulty with scapular engagement with RTB, therefore recommended YTB for home Seated R bent over row 2 x 10, 2nd set with 1# db Seated R bent over shoulder extension 2 x 10, 2nd set with 1# db Standing R shoulder flexion wall slide x 10   PATIENT EDUCATION:  Education details: HEP update  Person educated: Patient Education method: Explanation, Demonstration, Verbal cues, and Handouts Education comprehension: verbalized understanding, returned demonstration, verbal cues required, and needs further education  HOME EXERCISE PROGRAM: Access Code: [email or web address] URL: [email or web address] Date: 01/08/2023 Prepared by: Glenetta Hew  Exercises - Seated Scapular Retraction  - 3 x daily - 7 x weekly - 2 sets - 10 reps - 5 sec hold - Seated Shoulder Flexion Extension AAROM with Dowel into Wall (Mirrored)  - 2 x daily - 7 x weekly - 2 sets - 10 reps - 3 sec hold - Supine Shoulder Flexion AAROM with Dowel (Mirrored)  - 1 x daily - 7 x weekly - 2 sets - 10 reps - 3 sec hold - Supine Shoulder External  Rotation in 45 Degrees Abduction AAROM with Dowel (Mirrored)  - 1 x daily - 7 x weekly - 2 sets - 10 reps - 3 sec hold - Seated Shoulder External Rotation AAROM with Cane and Hand in Neutral  - 1 x daily - 7 x weekly - 2 sets - 10 reps - 3 sec hold - Isometric Shoulder Flexion at Wall (Mirrored)  - 1-2 x daily - 7 x weekly - 2 sets - 10 reps - 5 sec hold - Isometric Shoulder Abduction at Wall (Mirrored)  - 1-2 x daily - 7 x weekly - 2 sets - 10 reps - 5 sec hold - Isometric Shoulder Extension at Wall (Mirrored)  - 1-2 x daily - 7 x weekly - 2 sets - 10 reps - 5 sec hold - Isometric Shoulder Internal Rotation (Mirrored)  - 1-2 x daily - 7 x weekly - 2 sets - 10 reps - 5 sec hold - Isometric Shoulder External Rotation (Mirrored)  - 1-2 x daily - 7 x weekly - 2 sets - 10 reps - 5 sec hold - Seated Shoulder Flexion AAROM with Pulley Behind  - 1-2 x daily - 7 x weekly - 2 sets - 10 reps - 3 sec hold - Seated Shoulder Scaption AAROM with Pulley at Side  - 1-2 x daily - 7 x weekly - 2 sets - 10 reps - 3 sec hold - Seated Shoulder Row with Anchored Resistance  - 1 x daily - 3 x weekly - 2 sets - 10 reps - 3-5 hold hold - Seated Shoulder Extension and Scapular Retraction with Resistance  - 1 x daily - 3 x weekly - 2 sets - 10 reps - 3-5 sec hold - Seated Bent Over Shoulder Row with Dumbbells  - 1 x daily - 3 x weekly - 2 sets - 10 reps - 3 sec hold - Seated Shoulder Extension with Dumbbells  - 1 x daily - 3 x weekly - 2 sets - 10 reps - 3 sec hold - Shoulder Flexion Wall Slide with Towel  - 1 x daily - 7 x weekly - 2 sets - 10 reps - 3 sec hold - Scaption Wall Slide with Towel  - 1 x daily - 7 x weekly - 2 sets - 10 reps - 3 sec hold - Supine Single Arm Shoulder Protraction (Mirrored)  - 1 x daily - 3 x weekly - 2 sets - 10 reps - 3 sec hold - Supine Shoulder Circles (Mirrored)  - 1 x daily - 3 x weekly - 2 sets - 10 reps - 3 sec hold   ASSESSMENT:  CLINICAL IMPRESSION: Avelynn reports she is much more  aware of her posture and finds herself frequent tensing/shrugging her shoulders.  She continues to require PT facilitation initially with AROM into OH flexion and scaption, primarily for coordinated movement of the scapula and to avoid shoulder hike - movement patterns improving following facilitation.  Introduced further scapular control/stabilization exercises in supine  with HEP updated to include protraction/retraction and CW/CCW circles at 90 flexion.  Madlen will benefit from continued skilled PT to address ongoing R shoulder ROM and strength deficits to improve mobility and activity tolerance with decreased pain interference.   OBJECTIVE IMPAIRMENTS: decreased activity tolerance, decreased knowledge of condition, decreased mobility, decreased ROM, decreased strength, increased fascial restrictions, impaired perceived functional ability, increased muscle spasms, impaired flexibility, impaired UE functional use, improper body mechanics, postural dysfunction, and pain.   ACTIVITY LIMITATIONS: carrying, lifting, sleeping, bed mobility, bathing, toileting, dressing, self feeding, reach over head, hygiene/grooming, and caring for others  PARTICIPATION LIMITATIONS: meal prep, cleaning, laundry, driving, shopping, community activity, and yard work  PERSONAL FACTORS: Age, Fitness, Past/current experiences, Time since onset of injury/illness/exacerbation, and 3+ comorbidities: Ischemic CVA 06/2020; HTN; prediabetes; CKD-stage 3; anxiety; GERD  are also affecting patient's functional outcome.   REHAB POTENTIAL: Good  CLINICAL DECISION MAKING: Evolving/moderate complexity  EVALUATION COMPLEXITY: Moderate   GOALS: Goals reviewed with patient? Yes  SHORT TERM GOALS: Target date: 12/26/2022  Patient will be independent with initial HEP to improve outcomes and carryover.  Baseline: Initial HEP for AAROM provided on eval Goal status: MET - 12/26/22  LONG TERM GOALS: Target date: 01/23/2023  Patient  will be independent with ongoing/advanced HEP for self-management at home.  Baseline:  Goal status: IN PROGRESS - 01/08/23 - HEP updated  2.  Patient will report 50-75% improvement in R shoulder pain to improve QOL.  Baseline: Up to 6-7/10 Goal status: IN PROGRESS  3.  Patient to demonstrate improved upright posture with posterior shoulder girdle engaged to promote improved glenohumeral joint mobility. Baseline: Forward head and rounded shoulders with mildly protracted right shoulder Goal status: IN PROGRESS  4.  Patient to improve R shoulder AROM to [facility] without pain provocation to allow for increased ease of ADLs.  Baseline: Refer to above UE ROM table Goal status: IN PROGRESS  5.  Patient will demonstrate improved R shoulder strength to >/= 4/5 in available ROM for functional UE use. Baseline: Refer to above UE MMT table Goal status: IN PROGRESS  6  Patient will report </= 40% on QuickDASH to demonstrate improved functional ability.  Baseline: 50.0 / 100 = 50.0 % Goal status: IN PROGRESS  7.  Patient will report ability to don/doff shirt over her head without limitation due to R shoulder pain or LOM. Baseline: Patient unable to wear a T-shirt that she has to put over her head. Goal status: IN PROGRESS  12/04/22 - Pt able to use AAROM techniques to help don oversized T-shirt  8.  Patient will report ability to wash her hair without need for her husband's assistance. Baseline: Patient's husband currently helping her to wash her hair. Goal status: IN PROGRESS   PLAN:  PT FREQUENCY: 1-2x/week - Patient preferring to start with 1x/wk frequency due to transportation issues and discomfort being in open spaces  PT DURATION: 8 weeks  PLANNED INTERVENTIONS: Therapeutic exercises, Therapeutic activity, Patient/Family education, Self Care, Joint mobilization, Aquatic Therapy, Dry Needling, Electrical stimulation, Cryotherapy, Moist heat, Taping, Vasopneumatic device, Ultrasound,  Ionotophoresis 4mg /ml Dexamethasone, Manual therapy, and Re-evaluation  PLAN FOR NEXT SESSION:  *Pt prefers to remain in a individual treatment room with lights dimmed whenever possible* Gentle R shoulder AA/AROM; R shoulder gentle isometrics progressing to reactive isometrics as tolerated; light postural strengthening; review/update HEP as indicated  Marry Guan, PT 01/08/2023, 2:43 PM

## 2023-01-09 ENCOUNTER — Encounter: Payer: Self-pay | Admitting: Hematology and Oncology

## 2023-01-09 ENCOUNTER — Other Ambulatory Visit (HOSPITAL_COMMUNITY): Payer: Self-pay

## 2023-01-09 ENCOUNTER — Inpatient Hospital Stay: Payer: Medicare Other | Attending: Hematology and Oncology

## 2023-01-09 ENCOUNTER — Inpatient Hospital Stay (HOSPITAL_BASED_OUTPATIENT_CLINIC_OR_DEPARTMENT_OTHER): Payer: Medicare Other | Admitting: Hematology and Oncology

## 2023-01-09 ENCOUNTER — Inpatient Hospital Stay: Payer: Medicare Other

## 2023-01-09 VITALS — BP 129/61 | HR 85 | Temp 97.8°F | Resp 18 | Ht 63.5 in | Wt 182.4 lb

## 2023-01-09 DIAGNOSIS — R748 Abnormal levels of other serum enzymes: Secondary | ICD-10-CM | POA: Insufficient documentation

## 2023-01-09 DIAGNOSIS — Z8673 Personal history of transient ischemic attack (TIA), and cerebral infarction without residual deficits: Secondary | ICD-10-CM | POA: Insufficient documentation

## 2023-01-09 DIAGNOSIS — Z87891 Personal history of nicotine dependence: Secondary | ICD-10-CM | POA: Diagnosis not present

## 2023-01-09 DIAGNOSIS — D751 Secondary polycythemia: Secondary | ICD-10-CM

## 2023-01-09 DIAGNOSIS — D471 Chronic myeloproliferative disease: Secondary | ICD-10-CM | POA: Diagnosis present

## 2023-01-09 LAB — CBC WITH DIFFERENTIAL/PLATELET
Abs Immature Granulocytes: 0.01 10*3/uL (ref 0.00–0.07)
Basophils Absolute: 0.1 10*3/uL (ref 0.0–0.1)
Basophils Relative: 1 %
Eosinophils Absolute: 0.3 10*3/uL (ref 0.0–0.5)
Eosinophils Relative: 5 %
HCT: 43.7 % (ref 36.0–46.0)
Hemoglobin: 14.7 g/dL (ref 12.0–15.0)
Immature Granulocytes: 0 %
Lymphocytes Relative: 25 %
Lymphs Abs: 1.5 10*3/uL (ref 0.7–4.0)
MCH: 28.4 pg (ref 26.0–34.0)
MCHC: 33.6 g/dL (ref 30.0–36.0)
MCV: 84.4 fL (ref 80.0–100.0)
Monocytes Absolute: 0.7 10*3/uL (ref 0.1–1.0)
Monocytes Relative: 12 %
Neutro Abs: 3.5 10*3/uL (ref 1.7–7.7)
Neutrophils Relative %: 57 %
Platelets: 188 10*3/uL (ref 150–400)
RBC: 5.18 MIL/uL — ABNORMAL HIGH (ref 3.87–5.11)
RDW: 13.7 % (ref 11.5–15.5)
WBC: 6.1 10*3/uL (ref 4.0–10.5)
nRBC: 0 % (ref 0.0–0.2)

## 2023-01-09 MED ORDER — HYDROXYUREA 500 MG PO CAPS
500.0000 mg | ORAL_CAPSULE | Freq: Every day | ORAL | 3 refills | Status: DC
  Filled 2023-01-09: qty 30, 30d supply, fill #0

## 2023-01-09 NOTE — Assessment & Plan Note (Addendum)
Her hemoglobin is better and she is not symptomatic I will place threshold for phlebotomy at 15 She does not need phlebotomy today I will see her back at the end of the year for further follow-up

## 2023-01-09 NOTE — Assessment & Plan Note (Signed)
This is multifactorial, could be due to fatty liver disease Monitor closely

## 2023-01-09 NOTE — Progress Notes (Signed)
[facility] OFFICE PROGRESS NOTE  Eather Colas, FNP  ASSESSMENT & PLAN:  MPN (myeloproliferative neoplasm) (HCC) Her hemoglobin is better and she is not symptomatic I will place threshold for phlebotomy at 15 She does not need phlebotomy today I will see her back at the end of the year for further follow-up  Elevated liver enzymes This is multifactorial, could be due to fatty liver disease Monitor closely  No orders of the defined types were placed in this encounter.   The total time spent in the appointment was 20 minutes encounter with patients including review of chart and various tests results, discussions about plan of care and coordination of care plan   All questions were answered. The patient knows to call the clinic with any problems, questions or concerns. No barriers to learning was detected.    Artis Delay, MD 10/15/20242:37 PM  INTERVAL HISTORY: Monica Rivers 77 y.o. female returns for further follow-up for secondary polycythemia She has all her questions related to abnormal liver enzymes as well as her recent parathyroid hormone level I reviewed results with her.  Her parathyroid hormone was drawn due to hypercalcemia but on the day of her result, her calcium was back to normal  SUMMARY OF HEMATOLOGIC HISTORY:  She had a stroke approximately a year and a half ago and at the time of evaluation/admission, she was noted to have high hemoglobin The patient had 50+ pack year of smoking, only quit since the stroke diagnosis Since her history of stroke, she has residual dizziness, visual difficulties and balance problems.  She denies recent falls She had extensive evaluation by her primary care doctor recently and was noted to have several abnormal blood work including persistent polycythemia as well as abnormal iron studies.  She was also noted to have abnormal liver function and had ultrasound evaluation.  Iron studies was noted to be grossly  abnormal  She denies intermittent headaches, shortness of breath on exertion, frequent leg cramps or chest pain.   There is no prior diagnosis of obstructive sleep apnea. The patient denies weight loss or skin itching. 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  ALLERGIES:  is allergic to morphine and codeine, other, codeine, and penicillin g.  MEDICATIONS:  Current Outpatient Medications  Medication Sig Dispense Refill   cholecalciferol (VITAMIN D3) 25 MCG (1000 UNIT) tablet Take 2,000 Units by mouth daily.     clopidogrel (PLAVIX) 75 MG tablet Take 1 tablet (75 mg total) by mouth daily. Will need refills from PCP 30 tablet 0   LORazepam (ATIVAN) 0.5 MG tablet Take by mouth.     losartan-hydrochlorothiazide (HYZAAR) 100-25 MG tablet Take 1 tablet by mouth daily.     pantoprazole (PROTONIX) 20 MG tablet Take 20 mg by mouth daily.     No current facility-administered medications for this visit.     REVIEW OF SYSTEMS:   Constitutional: Denies fevers, chills or night sweats Eyes: Denies blurriness of vision Ears, nose, mouth, throat, and face: Denies mucositis or sore throat Respiratory: Denies cough, dyspnea or wheezes Cardiovascular: Denies palpitation, chest discomfort or lower extremity swelling Gastrointestinal:  Denies nausea,  heartburn or change in bowel habits Skin: Denies abnormal skin rashes Lymphatics: Denies new lymphadenopathy or easy bruising Neurological:Denies numbness, tingling or new weaknesses Behavioral/Psych: Mood is stable, no new changes  All other systems were reviewed with the patient and are  negative.  PHYSICAL EXAMINATION: ECOG PERFORMANCE STATUS: 0 - Asymptomatic  Vitals:   01/09/23 1257  BP: 129/61  Pulse: 85  Resp: 18  Temp: 97.8 F (36.6 C)  SpO2: 96%   Filed Weights   01/09/23 1257  Weight: 182 lb 6.4 oz (82.7 kg)    GENERAL:alert, no distress and comfortable   LABORATORY DATA:  I have reviewed the data as listed     Component Value Date/Time   NA 136 11/07/2022 1156   K 3.7 11/07/2022 1156   CL 101 11/07/2022 1156   CO2 28 11/07/2022 1156   GLUCOSE 203 (H) 11/07/2022 1156   BUN 30 (H) 11/07/2022 1156   CREATININE 1.17 (H) 11/07/2022 1156   CALCIUM 11.0 (H) 11/07/2022 1156   CALCIUM 9.1 07/03/2022 1422   PROT 6.8 11/07/2022 1156   ALBUMIN 4.2 11/07/2022 1156   AST 71 (H) 11/07/2022 1156   ALT 92 (H) 11/07/2022 1156   ALKPHOS 85 11/07/2022 1156   BILITOT 0.6 11/07/2022 1156   GFRNONAA 48 (L) 11/07/2022 1156    No results found for: "SPEP", "UPEP"  Lab Results  Component Value Date   WBC 6.1 01/09/2023   NEUTROABS 3.5 01/09/2023   HGB 14.7 01/09/2023   HCT 43.7 01/09/2023   MCV 84.4 01/09/2023   PLT 188 01/09/2023      Chemistry      Component Value Date/Time   NA 136 11/07/2022 1156   K 3.7 11/07/2022 1156   CL 101 11/07/2022 1156   CO2 28 11/07/2022 1156   BUN 30 (H) 11/07/2022 1156   CREATININE 1.17 (H) 11/07/2022 1156      Component Value Date/Time   CALCIUM 11.0 (H) 11/07/2022 1156   CALCIUM 9.1 07/03/2022 1422   ALKPHOS 85 11/07/2022 1156   AST 71 (H) 11/07/2022 1156   ALT 92 (H) 11/07/2022 1156   BILITOT 0.6 11/07/2022 1156

## 2023-01-11 ENCOUNTER — Telehealth: Payer: Self-pay | Admitting: Hematology and Oncology

## 2023-01-11 NOTE — Telephone Encounter (Signed)
Spoke with patient confirming upcoming appointments  

## 2023-01-15 ENCOUNTER — Encounter: Payer: Self-pay | Admitting: Physical Therapy

## 2023-01-15 ENCOUNTER — Ambulatory Visit: Payer: Medicare Other | Admitting: Physical Therapy

## 2023-01-15 DIAGNOSIS — M6281 Muscle weakness (generalized): Secondary | ICD-10-CM

## 2023-01-15 DIAGNOSIS — M25511 Pain in right shoulder: Secondary | ICD-10-CM

## 2023-01-15 DIAGNOSIS — R293 Abnormal posture: Secondary | ICD-10-CM

## 2023-01-15 DIAGNOSIS — M25611 Stiffness of right shoulder, not elsewhere classified: Secondary | ICD-10-CM

## 2023-01-15 NOTE — Therapy (Signed)
OUTPATIENT PHYSICAL THERAPY TREATMENT   Patient Name: Monica Rivers MRN: [id number] DOB:11/05/1945, 77 y.o., female Today's Date: 01/15/2023   END OF SESSION:  PT End of Session - 01/15/23 1315     Visit Number 7    Date for PT Re-Evaluation 01/23/23    Authorization Type Medicare & [employer]    PT Start Time 1315    PT Stop Time 1401    PT Time Calculation (min) 46 min    Activity Tolerance Patient tolerated treatment well    Behavior During Therapy [facility] for tasks assessed/performed                 Past Medical History:  Diagnosis Date   Acute ischemic stroke (HCC) 07/19/2020   Anxiety 08/05/2015   CKD (chronic kidney disease) stage 3, GFR 30-59 ml/min (HCC) 08/14/2016   GERD (gastroesophageal reflux disease) 01/31/2016   Hypertension 08/05/2015   Pre-diabetes 08/05/2015   Vitamin D insufficiency 08/28/2016   History reviewed. No pertinent surgical history. 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 02/20/2022   Acute ischemic stroke (HCC) 07/19/2020   Statin medication declined by patient 01/05/2020   Vitamin D insufficiency 08/28/2016   CKD (chronic kidney disease) stage 3, GFR 30-59 ml/min (HCC) 08/14/2016   GERD (gastroesophageal reflux disease) 01/31/2016   Anxiety 08/05/2015   Hypertension 08/05/2015   Pre-diabetes 08/05/2015    PCP: Eather Colas, FNP   REFERRING PROVIDER: Colvin Caroli, PA-C  REFERRING DIAG: [phone] (ICD-10-CM) - Unspecified fracture of upper end of right humerus, subsequent encounter for fracture with routine healing   THERAPY DIAG:  Stiffness of right shoulder, not elsewhere  classified  Acute pain of right shoulder  Muscle weakness (generalized)  Abnormal posture  RATIONALE FOR EVALUATION AND TREATMENT: Rehabilitation  ONSET DATE: 09/24/2022  NEXT MD VISIT: None scheduled   SUBJECTIVE:                                                                                                                                                                                                         SUBJECTIVE STATEMENT: Pt reports her pain has changed - she notes less of the muscle cramps but now more pain in the "ball" of her shoulder. She  notes she was able to wash her hair in the shower this morning.  EVAL: Pt reports she tripped over her dog on 09/24/2022 and fell on the tile floor landing on her L side but fracturing her R proximal humerus (not sure how she did it). No surgery but immobilized until last week. She reports she is R handed and having to learn to do things L handed sometimes has caused her more pain. Uses SPC when going out since her CVA, but does not use the cane at home.  PAIN: Are you having pain? Yes: NPRS scale:  5/10 Pain location: R shoulder joint Pain description: bony ache in "ball" of shoulder Aggravating factors: sleeping - laying on back, esp  Relieving factors: upright sitting, heating pad  PERTINENT HISTORY:  Ischemic CVA 06/2020; HTN; prediabetes; CKD-stage 3; anxiety; GERD  PRECAUTIONS: None  RED FLAGS: None  HAND DOMINANCE: Right  WEIGHT BEARING RESTRICTIONS: No  FALLS:  Has patient fallen in last 6 months? Yes. 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 Also be able to feed herself easier and run the vacuum."   OBJECTIVE: (objective measures completed at initial evaluation unless otherwise dated)  DIAGNOSTIC FINDINGS:  11/22/22 - R shoulder x-ray: reviewed/interpreted ap/outlet views of right shoulder showing healing proximal humerus fracture in good position - recommend physical therapy for the next 4 weeks then transition to HEP - pt in agreement - may d/c sling f/u in 4 weeks - for recheck if needed   10/25/22 - R shoulder x-ray: ap/outlet views of right shoulder showing healing greater tuberosity fracture in good position and placement - recommend gentle physical therapy to work on rom and isometrics - f/u in 4 weeks for recheck   PATIENT SURVEYS:  Quick Dash 50.0 / 100 = 50.0 %  COGNITION: Overall cognitive status: Within functional limits for tasks assessed     SENSATION: WFL  POSTURE: rounded shoulders and forward head  UPPER EXTREMITY ROM:   Active ROM Right eval Left eval R 01/02/23  Shoulder flexion 77 141 86  Shoulder extension 34 59 40  Shoulder abduction 53 162 * 66  Shoulder adduction     Shoulder internal rotation FIR lateral buttock FIR WFL   Shoulder external rotation 30 FER unable 72 FER T4 40  Elbow flexion     Elbow extension     Wrist flexion     Wrist extension     Wrist ulnar deviation     Wrist radial deviation     Wrist pronation     Wrist supination     (Blank rows = not tested)  UPPER EXTREMITY MMT:  MMT Right eval Left eval  Shoulder flexion 2- 5  Shoulder extension 2- 5  Shoulder abduction 2- 5  Shoulder adduction    Shoulder internal rotation 2- 5  Shoulder external rotation 2- 4  Middle trapezius    Lower trapezius    Elbow flexion    Elbow extension    Wrist flexion    Wrist extension    Wrist ulnar deviation  Wrist radial deviation    Wrist pronation    Wrist supination    Grip strength (lbs)     (Blank rows = not tested)  PALPATION:  Increased muscle tension and TTP in R pecs and anterolateral deltoids    TODAY'S TREATMENT:   01/15/23 THERAPEUTIC EXERCISE: to improve flexibility, strength and mobility.  Demonstration, verbal and tactile cues throughout for technique. Pulleys: Flexion and scaption x 3 min each Standing R shoulder wand IR: Drawing wand up back x 10 Drawing hand across back x 10 Seated R shoulder CW/CCW circles at ~90 flexion with ball on table 2 x 10 each Seated R shoulder "upper cut" flexion x 10 Seated R shoulder "hitchhiker" scaption x 10 Serratus press-up at wall x 10 Standing R shoulder flexion wall slide x 10 Standing R shoulder scaption wall slide attempted but deferred d/t increased pain  MANUAL THERAPY: To promote normalized muscle tension, improved flexibility, improved joint mobility, increased ROM, and reduced pain.  R shoulder gentle distraction +/- CW/CCW oscillations to promote muscle relaxation STM/DTM to R anterolateral deltoids Contract/relax into R shoulder IR & ER   01/08/23 THERAPEUTIC EXERCISE: to improve flexibility, strength and mobility.  Demonstration, verbal and tactile cues throughout for technique. Pulleys: Flexion and scaption x 3 min each Supine R shoulder flexion AA/AROM with HOB elevated to 20 2 x 10 - PT initially guiding motion and facilitating scap retraction and depression with pt gradually taking more active control Supine R shoulder scaption AA/AROM with HOB elevated to 20 2 x 10 - PT initially guiding motion and facilitating scap retraction and depression with pt gradually taking more active control Supine R shoulder protraction/retraction AA/AROM with arm vertical and HOB elevated to 20 2 x 10 - PT initially guiding motion and facilitating scap retraction and depression with pt gradually taking more active control Supine R shoulder CW/CCW circles with arm vertical and HOB elevated to 20 2 x 10 each  direction Supine R shoulder rhythmic stabilization with arm vertical and HOB elevated to 20 2 x 30 sec   MANUAL THERAPY: To promote normalized muscle tension, improved flexibility, improved joint mobility, increased ROM, and reduced pain.  R shoulder gentle distraction +/- CW/CCW oscillations to promote muscle relaxation STM/DTM to R teres group and lats   01/02/23 THERAPEUTIC EXERCISE: to improve flexibility, strength and mobility.  Demonstration, verbal and tactile cues throughout for technique. Pulleys: Flexion and scaption x 3 min each Seated B bent over row resting on pillow in lap 2 x 10, 2nd set with 1# db bil Seated B bent over shoulder extension with torso resting on pillow in lap 2 x 10, 2nd set with 1# db Seated YTB B scap retraction + shoulder row 10 x 3", 2 sets - cues to avoid shoulder shrug and excessive shoulder extension/upward tilt of elbows Seated YTB B scap retraction + shoulder extension 10 x 3", 2 sets  Supine R shoulder flexion AAROM with cane with HOB elevated to 20 x 10 Supine R shoulder flexion AA/AROM with HOB elevated to 20 2 x 10 - PT initially guiding motion and facilitating scap retraction and depression with pt gradually taking more active control Supine R shoulder scaption AA/AROM with HOB elevated to 20 2 x 10 - PT initially guiding motion and facilitating scap retraction and depression with pt gradually taking more active control   PATIENT EDUCATION:  Education details: progress with PT, ongoing PT POC, continue with current HEP, and plan for recert next visit   Person educated:  Patient Education method: Explanation Education comprehension: verbalized understanding and needs further education  HOME EXERCISE PROGRAM: Access Code: [email or web address] URL: [email or web address] Date: 01/08/2023 Prepared by: Glenetta Hew  Exercises - Seated Scapular Retraction  - 3 x daily - 7 x weekly - 2 sets - 10 reps - 5 sec hold - Seated Shoulder Flexion  Extension AAROM with Dowel into Wall (Mirrored)  - 2 x daily - 7 x weekly - 2 sets - 10 reps - 3 sec hold - Supine Shoulder Flexion AAROM with Dowel (Mirrored)  - 1 x daily - 7 x weekly - 2 sets - 10 reps - 3 sec hold - Supine Shoulder External Rotation in 45 Degrees Abduction AAROM with Dowel (Mirrored)  - 1 x daily - 7 x weekly - 2 sets - 10 reps - 3 sec hold - Seated Shoulder External Rotation AAROM with Cane and Hand in Neutral  - 1 x daily - 7 x weekly - 2 sets - 10 reps - 3 sec hold - Isometric Shoulder Flexion at Wall (Mirrored)  - 1-2 x daily - 7 x weekly - 2 sets - 10 reps - 5 sec hold - Isometric Shoulder Abduction at Wall (Mirrored)  - 1-2 x daily - 7 x weekly - 2 sets - 10 reps - 5 sec hold - Isometric Shoulder Extension at Wall (Mirrored)  - 1-2 x daily - 7 x weekly - 2 sets - 10 reps - 5 sec hold - Isometric Shoulder Internal Rotation (Mirrored)  - 1-2 x daily - 7 x weekly - 2 sets - 10 reps - 5 sec hold - Isometric Shoulder External Rotation (Mirrored)  - 1-2 x daily - 7 x weekly - 2 sets - 10 reps - 5 sec hold - Seated Shoulder Flexion AAROM with Pulley Behind  - 1-2 x daily - 7 x weekly - 2 sets - 10 reps - 3 sec hold - Seated Shoulder Scaption AAROM with Pulley at Side  - 1-2 x daily - 7 x weekly - 2 sets - 10 reps - 3 sec hold - Seated Shoulder Row with Anchored Resistance  - 1 x daily - 3 x weekly - 2 sets - 10 reps - 3-5 hold hold - Seated Shoulder Extension and Scapular Retraction with Resistance  - 1 x daily - 3 x weekly - 2 sets - 10 reps - 3-5 sec hold - Seated Bent Over Shoulder Row with Dumbbells  - 1 x daily - 3 x weekly - 2 sets - 10 reps - 3 sec hold - Seated Shoulder Extension with Dumbbells  - 1 x daily - 3 x weekly - 2 sets - 10 reps - 3 sec hold - Shoulder Flexion Wall Slide with Towel  - 1 x daily - 7 x weekly - 2 sets - 10 reps - 3 sec hold - Scaption Wall Slide with Towel  - 1 x daily - 7 x weekly - 2 sets - 10 reps - 3 sec hold - Supine Single Arm Shoulder  Protraction (Mirrored)  - 1 x daily - 3 x weekly - 2 sets - 10 reps - 3 sec hold - Supine Shoulder Circles (Mirrored)  - 1 x daily - 3 x weekly - 2 sets - 10 reps - 3 sec hold   ASSESSMENT:  CLINICAL IMPRESSION: Monica Rivers reports improving functional use of her R shoulder/UE, noting ability to shampoo her hair in the shower this morning, although continuing to require her husband's assist to rinse her  hair.  She is also now able to don a shirt over her head but continues to require assistance to pull it down in the back unless it is one of her husbands oversized T-shirts.  She reports decreasing proximal UE pain, however pain now more localized at the [facility] joint and surrounding musculature.  Flexion ROM improving, however she continues to have limited tolerance with some motion in the scapular/abduction plane as well as FER and FIR.  Given ongoing limited R shoulder AROM, strength and functional deficits, anticipate Monica Rivers will benefit from recert next visit for continued skilled PT to address ongoing R shoulder ROM and strength deficits to improve mobility and activity tolerance with decreased pain interference.   OBJECTIVE IMPAIRMENTS: decreased activity tolerance, decreased knowledge of condition, decreased mobility, decreased ROM, decreased strength, increased fascial restrictions, impaired perceived functional ability, increased muscle spasms, impaired flexibility, impaired UE functional use, improper body mechanics, postural dysfunction, and pain.   ACTIVITY LIMITATIONS: carrying, lifting, sleeping, bed mobility, bathing, toileting, dressing, self feeding, reach over head, hygiene/grooming, and caring for others  PARTICIPATION LIMITATIONS: meal prep, cleaning, laundry, driving, shopping, community activity, and yard work  PERSONAL FACTORS: Age, Fitness, Past/current experiences, Time since onset of injury/illness/exacerbation, and 3+ comorbidities: Ischemic CVA 06/2020; HTN; prediabetes; CKD-stage 3;  anxiety; GERD  are also affecting patient's functional outcome.   REHAB POTENTIAL: Good  CLINICAL DECISION MAKING: Evolving/moderate complexity  EVALUATION COMPLEXITY: Moderate   GOALS: Goals reviewed with patient? Yes  SHORT TERM GOALS: Target date: 12/26/2022  Patient will be independent with initial HEP to improve outcomes and carryover.  Baseline: Initial HEP for AAROM provided on eval Goal status: MET - 12/26/22  LONG TERM GOALS: Target date: 01/23/2023  Patient will be independent with ongoing/advanced HEP for self-management at home.  Baseline:  Goal status: IN PROGRESS - 01/08/23 - HEP updated  2.  Patient will report 50-75% improvement in R shoulder pain to improve QOL.  Baseline: Up to 6-7/10 Goal status: IN PROGRESS  3.  Patient to demonstrate improved upright posture with posterior shoulder girdle engaged to promote improved glenohumeral joint mobility. Baseline: Forward head and rounded shoulders with mildly protracted right shoulder Goal status: IN PROGRESS  4.  Patient to improve R shoulder AROM to [facility] without pain provocation to allow for increased ease of ADLs.  Baseline: Refer to above UE ROM table Goal status: IN PROGRESS  5.  Patient will demonstrate improved R shoulder strength to >/= 4/5 in available ROM for functional UE use. Baseline: Refer to above UE MMT table Goal status: IN PROGRESS  6  Patient will report </= 40% on QuickDASH to demonstrate improved functional ability.  Baseline: 50.0 / 100 = 50.0 % Goal status: IN PROGRESS  7.  Patient will report ability to don/doff shirt over her head without limitation due to R shoulder pain or LOM. Baseline: Patient unable to wear a T-shirt that she has to put over her head. Goal status: IN PROGRESS  01/15/23 - Pt able to get a T-shirt on most of the way but sometimes needs her husband to help her pull the shirt down in the back or has to wear her husband's 2XL shirts  8.  Patient will report ability to  wash her hair without need for her husband's assistance. Baseline: Patient's husband currently helping her to wash her hair. Goal status: PARTIALLY MET - 01/15/23 - Pt reports she was able to massage the shampoo into her hair but needed her husband's assistance to reach the showerhead  to rinse her hair   PLAN:  PT FREQUENCY: 1-2x/week - Patient preferring to start with 1x/wk frequency due to transportation issues and discomfort being in open spaces  PT DURATION: 8 weeks  PLANNED INTERVENTIONS: Therapeutic exercises, Therapeutic activity, Patient/Family education, Self Care, Joint mobilization, Aquatic Therapy, Dry Needling, Electrical stimulation, Cryotherapy, Moist heat, Taping, Vasopneumatic device, Ultrasound, Ionotophoresis 4mg /ml Dexamethasone, Manual therapy, and Re-evaluation  PLAN FOR NEXT SESSION:  Recert; Gentle R shoulder AA/AROM; R shoulder gentle isometrics progressing to reactive isometrics as tolerated; light postural strengthening; review/update HEP as indicated  *Pt prefers to remain in a individual treatment room with lights dimmed whenever possible*   Marry Guan, PT 01/15/2023, 2:07 PM

## 2023-01-22 ENCOUNTER — Ambulatory Visit: Payer: Medicare Other | Admitting: Physical Therapy

## 2023-02-08 ENCOUNTER — Encounter: Payer: Self-pay | Admitting: Physical Therapy

## 2023-02-08 ENCOUNTER — Ambulatory Visit: Payer: Medicare Other | Attending: Physician Assistant | Admitting: Physical Therapy

## 2023-02-08 DIAGNOSIS — M25611 Stiffness of right shoulder, not elsewhere classified: Secondary | ICD-10-CM | POA: Insufficient documentation

## 2023-02-08 DIAGNOSIS — R293 Abnormal posture: Secondary | ICD-10-CM | POA: Insufficient documentation

## 2023-02-08 DIAGNOSIS — M25511 Pain in right shoulder: Secondary | ICD-10-CM | POA: Insufficient documentation

## 2023-02-08 DIAGNOSIS — M6281 Muscle weakness (generalized): Secondary | ICD-10-CM | POA: Diagnosis present

## 2023-02-08 NOTE — Therapy (Signed)
OUTPATIENT PHYSICAL THERAPY TREATMENT / RE-CERTIFICATION  Progress Note  Reporting Period 11/28/2022 to 02/08/2023   See note below for Objective Data and Assessment of Progress/Goals.     Patient Name: Monica Rivers MRN: [id number] DOB:[birth year], 77 y.o., female Today's Date: 02/08/2023   END OF SESSION:  PT End of Session - 02/08/23 1358     Visit Number 8    Date for PT Re-Evaluation 04/05/23    Authorization Type Medicare & [employer]    PT Start Time 1358    PT Stop Time 1448    PT Time Calculation (min) 50 min    Activity Tolerance Patient tolerated treatment well    Behavior During Therapy [facility] for tasks assessed/performed                  Past Medical History:  Diagnosis Date   Acute ischemic stroke (HCC) 07/19/2020   Anxiety 08/05/2015   CKD (chronic kidney disease) stage 3, GFR 30-59 ml/min (HCC) 08/14/2016   GERD (gastroesophageal reflux disease) 01/31/2016   Hypertension 08/05/2015   Pre-diabetes 08/05/2015   Vitamin D insufficiency 08/28/2016   History reviewed. No pertinent surgical history. 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 02/20/2022   Acute ischemic stroke (HCC) 07/19/2020   Statin medication declined by patient 01/05/2020   Vitamin D insufficiency 08/28/2016   CKD (chronic kidney disease) stage 3, GFR 30-59 ml/min (HCC) 08/14/2016   GERD (gastroesophageal reflux disease) 01/31/2016   Anxiety 08/05/2015   Hypertension 08/05/2015   Pre-diabetes 08/05/2015    PCP: Eather Colas, FNP   REFERRING PROVIDER: Colvin Caroli, PA-C  REFERRING DIAG: [phone] (ICD-10-CM) - Unspecified  fracture of upper end of right humerus, subsequent encounter for fracture with routine healing   THERAPY DIAG:  Stiffness of right shoulder, not elsewhere classified  Acute pain of right shoulder  Muscle weakness (generalized)  Abnormal posture  RATIONALE FOR EVALUATION AND TREATMENT: Rehabilitation  ONSET DATE: 09/24/2022  NEXT MD VISIT: None scheduled   SUBJECTIVE:                                                                                                                                                                                                         SUBJECTIVE  STATEMENT: Pt reports "I've been better" - less pain and has not used heating pad in a week. Also noting better fwd flexion but still more limited with abduction (difficulty putting on deodorant).   EVAL: Pt reports she tripped over her dog on 09/24/2022 and fell on the tile floor landing on her L side but fracturing her R proximal humerus (not sure how she did it). No surgery but immobilized until last week. She reports she is R handed and having to learn to do things L handed sometimes has caused her more pain. Uses SPC when going out since her CVA, but does not use the cane at home.  PAIN: Are you having pain? Yes: NPRS scale: 2/10 Pain location: R shoulder joint & upper arm Pain description: intermittently uncomfortable  Aggravating factors: car trip (vibration?), movement away from body (putting on deodorant)  Relieving factors: having her husband rub her arm/shoulder  PERTINENT HISTORY:  Ischemic CVA 06/2020; HTN; prediabetes; CKD-stage 3; anxiety; GERD  PRECAUTIONS: None  RED FLAGS: None  HAND DOMINANCE: Right  WEIGHT BEARING RESTRICTIONS: No  FALLS:  Has patient fallen in last 6 months? Yes. 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 Also be able to feed herself easier and run the vacuum."   OBJECTIVE: (objective measures completed at initial evaluation unless otherwise dated)  DIAGNOSTIC FINDINGS:  11/22/22 - R shoulder x-ray: reviewed/interpreted ap/outlet views of right shoulder showing healing proximal humerus fracture in good position - recommend physical therapy for the next 4 weeks then transition to HEP - pt in agreement - may d/c sling f/u in 4 weeks - for recheck if needed   10/25/22 - R shoulder x-ray: ap/outlet views of right shoulder showing healing greater tuberosity fracture in good position and placement - recommend gentle physical therapy to work on rom and isometrics - f/u in 4 weeks for recheck   PATIENT SURVEYS:  Quick Dash 50.0 / 100 = 50.0 % ; 40.9 / 100 = 40.9 % (02/08/23)  COGNITION: Overall cognitive status: Within functional limits for tasks assessed     SENSATION: WFL  POSTURE: rounded shoulders and forward head  UPPER EXTREMITY ROM:   Active ROM Right eval Left eval R 01/02/23 R 02/08/23  Shoulder flexion 77 141 86 108 A ^ 127 AA  Shoulder extension 34 59 40 41  Shoulder abduction 53 162 * 66 79 A ^   89 P  Shoulder adduction      Shoulder internal rotation FIR lateral buttock FIR WFL  FIR lateral buttock  Shoulder external rotation 30 FER unable 72 FER T4 40 48 ^  FER upper shoulder  (Blank rows = not tested, ^ - pain/discomfort)  UPPER EXTREMITY MMT:  MMT Right eval Left eval R* 02/08/23 L 02/08/23  Shoulder flexion 2- 5 3+ 5  Shoulder extension 2- 5 4 5   Shoulder abduction 2- 5 3+ 5  Shoulder adduction      Shoulder internal rotation 2- 5 4+ 5  Shoulder external rotation 2- 4 4 4   Middle  trapezius  Lower trapezius      Elbow flexion      Elbow extension      Wrist flexion      Wrist extension      Wrist ulnar deviation      Wrist radial deviation      Wrist pronation      Wrist supination      Grip strength (lbs)      (Blank rows = not tested, * - for available ROM)  PALPATION:  Increased muscle tension and TTP in R pecs and anterolateral deltoids    TODAY'S TREATMENT:   02/08/23 THERAPEUTIC EXERCISE: to improve flexibility, strength and mobility.  Demonstration, verbal and tactile cues throughout for technique. Seated pulleys: Flexion and scaption x 3 min each Seated R shoulder flexion AROM x 5 Seated R shoulder flexion AAROM concentric with eccentric AROM x 5 - patient able to achieve higher degree of elevation in flexion Standing R shoulder wand IR: Drawing wand up back x 10 Drawing hand across back x 10  THERAPEUTIC ACTIVITIES: QuickDASH: 40.9 / 100 = 40.9 % R shoulder ROM assessment UE MMT Goal assessment   01/15/23 THERAPEUTIC EXERCISE: to improve flexibility, strength and mobility.  Demonstration, verbal and tactile cues throughout for technique. Pulleys: Flexion and scaption x 3 min each Standing R shoulder wand IR: Drawing wand up back x 10 Drawing hand across back x 10 Seated R shoulder CW/CCW circles at ~90 flexion with ball on table 2 x 10 each Seated R shoulder "upper cut" flexion x 10 Seated R shoulder "hitchhiker" scaption x 10 Serratus press-up at wall x 10 Standing R shoulder flexion wall slide x 10 Standing R shoulder scaption wall slide attempted but deferred d/t increased pain  MANUAL THERAPY: To promote normalized muscle tension, improved flexibility, improved joint mobility, increased ROM, and reduced pain.  R shoulder gentle distraction +/- CW/CCW oscillations to promote muscle relaxation STM/DTM to R anterolateral deltoids Contract/relax into R shoulder IR & ER   01/08/23 THERAPEUTIC EXERCISE: to improve flexibility,  strength and mobility.  Demonstration, verbal and tactile cues throughout for technique. Pulleys: Flexion and scaption x 3 min each Supine R shoulder flexion AA/AROM with HOB elevated to 20 2 x 10 - PT initially guiding motion and facilitating scap retraction and depression with pt gradually taking more active control Supine R shoulder scaption AA/AROM with HOB elevated to 20 2 x 10 - PT initially guiding motion and facilitating scap retraction and depression with pt gradually taking more active control Supine R shoulder protraction/retraction AA/AROM with arm vertical and HOB elevated to 20 2 x 10 - PT initially guiding motion and facilitating scap retraction and depression with pt gradually taking more active control Supine R shoulder CW/CCW circles with arm vertical and HOB elevated to 20 2 x 10 each direction Supine R shoulder rhythmic stabilization with arm vertical and HOB elevated to 20 2 x 30 sec   MANUAL THERAPY: To promote normalized muscle tension, improved flexibility, improved joint mobility, increased ROM, and reduced pain.  R shoulder gentle distraction +/- CW/CCW oscillations to promote muscle relaxation STM/DTM to R teres group and lats   PATIENT EDUCATION:  Education details: progress with PT, ongoing PT POC, and continue with current HEP  Person educated: Patient Education method: Explanation Education comprehension: verbalized understanding and needs further education  HOME EXERCISE PROGRAM: Access Code: [email or web address] URL: [email or web address] Date: 01/08/2023 Prepared by: Glenetta Hew  Exercises - Seated Scapular Retraction  - 3 x daily - 7 x weekly -  2 sets - 10 reps - 5 sec hold - Seated Shoulder Flexion Extension AAROM with Dowel into Wall (Mirrored)  - 2 x daily - 7 x weekly - 2 sets - 10 reps - 3 sec hold - Supine Shoulder Flexion AAROM with Dowel (Mirrored)  - 1 x daily - 7 x weekly - 2 sets - 10 reps - 3 sec hold - Supine Shoulder External  Rotation in 45 Degrees Abduction AAROM with Dowel (Mirrored)  - 1 x daily - 7 x weekly - 2 sets - 10 reps - 3 sec hold - Seated Shoulder External Rotation AAROM with Cane and Hand in Neutral  - 1 x daily - 7 x weekly - 2 sets - 10 reps - 3 sec hold - Isometric Shoulder Flexion at Wall (Mirrored)  - 1-2 x daily - 7 x weekly - 2 sets - 10 reps - 5 sec hold - Isometric Shoulder Abduction at Wall (Mirrored)  - 1-2 x daily - 7 x weekly - 2 sets - 10 reps - 5 sec hold - Isometric Shoulder Extension at Wall (Mirrored)  - 1-2 x daily - 7 x weekly - 2 sets - 10 reps - 5 sec hold - Isometric Shoulder Internal Rotation (Mirrored)  - 1-2 x daily - 7 x weekly - 2 sets - 10 reps - 5 sec hold - Isometric Shoulder External Rotation (Mirrored)  - 1-2 x daily - 7 x weekly - 2 sets - 10 reps - 5 sec hold - Seated Shoulder Flexion AAROM with Pulley Behind  - 1-2 x daily - 7 x weekly - 2 sets - 10 reps - 3 sec hold - Seated Shoulder Scaption AAROM with Pulley at Side  - 1-2 x daily - 7 x weekly - 2 sets - 10 reps - 3 sec hold - Seated Shoulder Row with Anchored Resistance  - 1 x daily - 3 x weekly - 2 sets - 10 reps - 3-5 hold hold - Seated Shoulder Extension and Scapular Retraction with Resistance  - 1 x daily - 3 x weekly - 2 sets - 10 reps - 3-5 sec hold - Seated Bent Over Shoulder Row with Dumbbells  - 1 x daily - 3 x weekly - 2 sets - 10 reps - 3 sec hold - Seated Shoulder Extension with Dumbbells  - 1 x daily - 3 x weekly - 2 sets - 10 reps - 3 sec hold - Shoulder Flexion Wall Slide with Towel  - 1 x daily - 7 x weekly - 2 sets - 10 reps - 3 sec hold - Scaption Wall Slide with Towel  - 1 x daily - 7 x weekly - 2 sets - 10 reps - 3 sec hold - Supine Single Arm Shoulder Protraction (Mirrored)  - 1 x daily - 3 x weekly - 2 sets - 10 reps - 3 sec hold - Supine Shoulder Circles (Mirrored)  - 1 x daily - 3 x weekly - 2 sets - 10 reps - 3 sec hold   ASSESSMENT:  CLINICAL IMPRESSION: Monica Rivers reports 80% improvement in  R shoulder/proximal UE pain.  She reports improving functional R shoulder flexion AROM but notes that she can get better motion if she uses her other hand to assist with elevation but once there can hold her arm in the new position.  Functionally she is now able to independently shampoo and rinse her hair in the shower and can don a T-shirt overhead, however still notes difficulty  with motions out to the side such as applying deodorant.  She continues to lack the ability to reach to her nightstand or behind her back.  Her R shoulder strength is improving for the available ROM but remains limited as compared to the L.  QuickDASH demonstrating 9% improvement in UE disability.  Monica Rivers is demonstrating good progress towards her PT goals, however given ongoing limited R shoulder AROM, strength and functional deficits, will recommend recert 1x/wk for up to 8 weeks for continued skilled PT to address ongoing R shoulder ROM and strength deficits to improve mobility and activity tolerance with decreased pain interference.   OBJECTIVE IMPAIRMENTS: decreased activity tolerance, decreased knowledge of condition, decreased mobility, decreased ROM, decreased strength, increased fascial restrictions, impaired perceived functional ability, increased muscle spasms, impaired flexibility, impaired UE functional use, improper body mechanics, postural dysfunction, and pain.   ACTIVITY LIMITATIONS: carrying, lifting, sleeping, bed mobility, bathing, toileting, dressing, self feeding, reach over head, hygiene/grooming, and caring for others  PARTICIPATION LIMITATIONS: meal prep, cleaning, laundry, driving, shopping, community activity, and yard work  PERSONAL FACTORS: Age, Fitness, Past/current experiences, Time since onset of injury/illness/exacerbation, and 3+ comorbidities: Ischemic CVA 06/2020; HTN; prediabetes; CKD-stage 3; anxiety; GERD  are also affecting patient's functional outcome.   REHAB POTENTIAL: Good  CLINICAL  DECISION MAKING: Evolving/moderate complexity  EVALUATION COMPLEXITY: Moderate   GOALS: Goals reviewed with patient? Yes  SHORT TERM GOALS: Target date: 12/26/2022  Patient will be independent with initial HEP to improve outcomes and carryover.  Baseline: Initial HEP for AAROM provided on eval Goal status: MET - 12/26/22  LONG TERM GOALS: Target date: 01/23/2023, extended to 04/05/2023  Patient will be independent with ongoing/advanced HEP for self-management at home.  Baseline:  Goal status: IN PROGRESS - 02/08/23 - Met for current HEP   2.  Patient will report 50-75% improvement in R shoulder pain to improve QOL.  Baseline: Up to 6-7/10 Goal status: MET - 02/08/23 - 80% improvement in pain  3.  Patient to demonstrate improved upright posture with posterior shoulder girdle engaged to promote improved glenohumeral joint mobility. Baseline: Forward head and rounded shoulders with mildly protracted right shoulder Goal status: IN PROGRESS - 02/08/23 - pt continued to require frequent cues or self-correction for posture  4.  Patient to improve R shoulder AROM to [facility] without pain provocation to allow for increased ease of ADLs.  Baseline: Refer to above UE ROM table Goal status: IN PROGRESS - 02/08/23 - R shoulder ROM improving but still limited functionally, esp with abduction and FIR/FER  5.  Patient will demonstrate improved R shoulder strength to >/= 4/5 in available ROM for functional UE use. Baseline: Refer to above UE MMT table Goal status: PARTIALLY MET - 02/08/23  6  Patient will report </= 40% on QuickDASH to demonstrate improved functional ability.  Baseline: 50.0 / 100 = 50.0 % Goal status: IN PROGRESS - 02/08/23 -  40.9 / 100 = 40.9 %  7.  Patient will report ability to don/doff shirt over her head without limitation due to R shoulder pain or LOM. Baseline: Patient unable to wear a T-shirt that she has to put over her head. Goal status: MET - 02/08/23   8.  Patient  will report ability to wash her hair without need for her husband's assistance. Baseline: Patient's husband currently helping her to wash her hair. Goal status:  MET - 02/08/23   9.  Patient will be able to reach to her nightstand to pick up a book  w/o increased pain. Baseline:  Goal status: INITIAL - 02/08/23  PLAN:  PT FREQUENCY: 1x/week - per patient preference due to transportation issues and discomfort being in open spaces  PT DURATION: 8 weeks  PLANNED INTERVENTIONS: Therapeutic exercises, Therapeutic activity, Patient/Family education, Self Care, Joint mobilization, Aquatic Therapy, Dry Needling, Electrical stimulation, Cryotherapy, Moist heat, Taping, Vasopneumatic device, Ultrasound, Ionotophoresis 4mg /ml Dexamethasone, Manual therapy, and Re-evaluation  PLAN FOR NEXT SESSION:  Gentle R shoulder AA/AROM; R shoulder gentle isometrics progressing to reactive isometrics as tolerated; postural strengthening; review/update HEP as indicated  *Pt prefers to remain in a individual treatment room with lights dimmed whenever possible*   Marry Guan, PT 02/08/2023, 4:12 PM

## 2023-02-12 ENCOUNTER — Ambulatory Visit: Payer: Medicare Other | Admitting: Physical Therapy

## 2023-02-19 ENCOUNTER — Encounter: Payer: Self-pay | Admitting: Physical Therapy

## 2023-02-19 ENCOUNTER — Ambulatory Visit: Payer: Medicare Other | Admitting: Physical Therapy

## 2023-02-19 DIAGNOSIS — M25611 Stiffness of right shoulder, not elsewhere classified: Secondary | ICD-10-CM

## 2023-02-19 DIAGNOSIS — M6281 Muscle weakness (generalized): Secondary | ICD-10-CM

## 2023-02-19 DIAGNOSIS — M25511 Pain in right shoulder: Secondary | ICD-10-CM

## 2023-02-19 DIAGNOSIS — R293 Abnormal posture: Secondary | ICD-10-CM

## 2023-02-19 NOTE — Therapy (Signed)
OUTPATIENT PHYSICAL THERAPY TREATMENT     Patient Name: Monica Rivers MRN: [id number] DOB:Sep 14, 1945, 77 y.o., female Today's Date: 02/19/2023   END OF SESSION:  PT End of Session - 02/19/23 1315     Visit Number 9    Date for PT Re-Evaluation 04/05/23    Authorization Type Medicare & [employer]    Progress Note Due on Visit 18   Recert on visit #8 - 02/08/23   PT Start Time 1315    PT Stop Time 1359    PT Time Calculation (min) 44 min    Activity Tolerance Patient tolerated treatment well    Behavior During Therapy [facility] for tasks assessed/performed                  Past Medical History:  Diagnosis Date   Acute ischemic stroke (HCC) 07/19/2020   Anxiety 08/05/2015   CKD (chronic kidney disease) stage 3, GFR 30-59 ml/min (HCC) 08/14/2016   GERD (gastroesophageal reflux disease) 01/31/2016   Hypertension 08/05/2015   Pre-diabetes 08/05/2015   Vitamin D insufficiency 08/28/2016   History reviewed. No pertinent surgical history. 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 02/20/2022   Acute ischemic stroke (HCC) 07/19/2020   Statin medication declined by patient 01/05/2020   Vitamin D insufficiency 08/28/2016   CKD (chronic kidney disease) stage 3, GFR 30-59 ml/min (HCC) 08/14/2016   GERD (gastroesophageal reflux disease) 01/31/2016   Anxiety 08/05/2015   Hypertension 08/05/2015   Pre-diabetes 08/05/2015    PCP: Eather Colas, FNP   REFERRING PROVIDER: Colvin Caroli, PA-C  REFERRING DIAG: [phone] (ICD-10-CM) - Unspecified fracture of upper end of right humerus, subsequent encounter for fracture with routine  healing   THERAPY DIAG:  Stiffness of right shoulder, not elsewhere classified  Acute pain of right shoulder  Muscle weakness (generalized)  Abnormal posture  RATIONALE FOR EVALUATION AND TREATMENT: Rehabilitation  ONSET DATE: 09/24/2022  NEXT MD VISIT: None scheduled   SUBJECTIVE:                                                                                                                                                                                                         SUBJECTIVE STATEMENT: Pt reports her R shoulder pain  has been more variable but still only typically up to 2/10.  Some increased pain reported after trying to vacuum.   EVAL: Pt reports she tripped over her dog on 09/24/2022 and fell on the tile floor landing on her L side but fracturing her R proximal humerus (not sure how she did it). No surgery but immobilized until last week. She reports she is R handed and having to learn to do things L handed sometimes has caused her more pain. Uses SPC when going out since her CVA, but does not use the cane at home.  PAIN: Are you having pain? Yes: NPRS scale: up to 2/10 Pain location: R shoulder joint & upper arm Pain description: intermittently uncomfortable  Aggravating factors: car trip (vibration?), movement away from body (putting on deodorant)  Relieving factors: having her husband rub her arm/shoulder  PERTINENT HISTORY:  Ischemic CVA 06/2020; HTN; prediabetes; CKD-stage 3; anxiety; GERD  PRECAUTIONS: None  RED FLAGS: None  HAND DOMINANCE: Right  WEIGHT BEARING RESTRICTIONS: No  FALLS:  Has patient fallen in last 6 months? Yes. 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 Also be able to feed herself easier and run the vacuum."   OBJECTIVE: (objective measures completed at initial evaluation unless otherwise dated)  DIAGNOSTIC FINDINGS:  11/22/22 - R shoulder x-ray: reviewed/interpreted ap/outlet views of right shoulder showing healing proximal humerus fracture in good position - recommend physical therapy for the next 4 weeks then transition to HEP - pt in agreement - may d/c sling f/u in 4 weeks - for recheck if needed   10/25/22 - R shoulder x-ray: ap/outlet views of right shoulder showing healing greater tuberosity fracture in good position and placement - recommend gentle physical therapy to work on rom and isometrics - f/u in 4 weeks for recheck   PATIENT SURVEYS:  Quick Dash 50.0 / 100 = 50.0 % ; 40.9 / 100 = 40.9 % (02/08/23)  COGNITION: Overall cognitive status: Within functional limits for tasks assessed     SENSATION: WFL  POSTURE: rounded shoulders and forward head  UPPER EXTREMITY ROM:   Active ROM Right eval Left eval R 01/02/23 R 02/08/23  Shoulder flexion 77 141 86 108 A ^ 127 AA  Shoulder extension 34 59 40 41  Shoulder abduction 53 162 * 66 79 A ^   89 P  Shoulder adduction      Shoulder internal rotation FIR lateral buttock FIR WFL  FIR lateral buttock  Shoulder external rotation 30 FER unable 72 FER T4 40 48 ^  FER upper shoulder  (Blank rows = not tested, ^ - pain/discomfort)  UPPER EXTREMITY MMT:  MMT Right eval Left eval R* 02/08/23 L 02/08/23  Shoulder flexion 2- 5 3+ 5  Shoulder extension 2- 5 4 5   Shoulder abduction 2- 5 3+ 5  Shoulder adduction      Shoulder internal rotation 2- 5 4+ 5  Shoulder external rotation 2- 4 4 4   Middle trapezius      Lower trapezius      Elbow flexion  Elbow extension      Wrist flexion      Wrist extension      Wrist  ulnar deviation      Wrist radial deviation      Wrist pronation      Wrist supination      Grip strength (lbs)      (Blank rows = not tested, * - for available ROM)  PALPATION:  Increased muscle tension and TTP in R pecs and anterolateral deltoids    TODAY'S TREATMENT:   02/19/23 THERAPEUTIC EXERCISE: to improve flexibility, strength and mobility.  Demonstration, verbal and tactile cues throughout for technique. Seated pulleys: Flexion and scaption x 3 min each Cabinet reaches: R shoulder Flexion with Free-Up tub (16 oz - 3/4 full) to 1st shelf 2 x 10 R shoulder Flexion fingertip touches to 2nd shelf, progressing to 4 oz putty tub, then Free-Up tub R shoulder Scaption fingertip touches to 1st shelf, progressing to 4 oz putty tub, then Free-Up tub Towel assisted R shoulder FER & FIR AAROM 2 x 10 each R shoulder CW/CCW circles at 90 flexion 2 x 5 each, 1 set each with towel and ball on wall  R shoulder CW/CCW circles at 90 scaption 2 x 5 each, 1 set each with towel and ball on wall    02/08/23 THERAPEUTIC EXERCISE: to improve flexibility, strength and mobility.  Demonstration, verbal and tactile cues throughout for technique. Seated pulleys: Flexion and scaption x 3 min each Seated R shoulder flexion AROM x 5 Seated R shoulder flexion AAROM concentric with eccentric AROM x 5 - patient able to achieve higher degree of elevation in flexion Standing R shoulder wand IR: Drawing wand up back x 10 Drawing hand across back x 10  THERAPEUTIC ACTIVITIES: QuickDASH: 40.9 / 100 = 40.9 % R shoulder ROM assessment UE MMT Goal assessment   01/15/23 THERAPEUTIC EXERCISE: to improve flexibility, strength and mobility.  Demonstration, verbal and tactile cues throughout for technique. Pulleys: Flexion and scaption x 3 min each Standing R shoulder wand IR: Drawing wand up back x 10 Drawing hand across back x 10 Seated R shoulder CW/CCW circles at ~90 flexion with ball on table 2 x 10  each Seated R shoulder "upper cut" flexion x 10 Seated R shoulder "hitchhiker" scaption x 10 Serratus press-up at wall x 10 Standing R shoulder flexion wall slide x 10 Standing R shoulder scaption wall slide attempted but deferred d/t increased pain  MANUAL THERAPY: To promote normalized muscle tension, improved flexibility, improved joint mobility, increased ROM, and reduced pain.  R shoulder gentle distraction +/- CW/CCW oscillations to promote muscle relaxation STM/DTM to R anterolateral deltoids Contract/relax into R shoulder IR & ER   PATIENT EDUCATION:  Education details: progress with PT, ongoing PT POC, and continue with current HEP  Person educated: Patient Education method: Explanation Education comprehension: verbalized understanding and needs further education  HOME EXERCISE PROGRAM: Access Code: [email or web address] URL: [email or web address] Date: 02/19/2023 Prepared by: Glenetta Hew  Exercises - Seated Scapular Retraction  - 3 x daily - 7 x weekly - 2 sets - 10 reps - 5 sec hold - Seated Shoulder Flexion Extension AAROM with Dowel into Wall (Mirrored)  - 2 x daily - 7 x weekly - 2 sets - 10 reps - 3 sec hold - Supine Shoulder Flexion AAROM with Dowel (Mirrored)  - 1 x daily - 7 x weekly - 2 sets - 10 reps - 3 sec hold - Supine Shoulder External Rotation in 45  Degrees Abduction AAROM with Dowel (Mirrored)  - 1 x daily - 7 x weekly - 2 sets - 10 reps - 3 sec hold - Seated Shoulder External Rotation AAROM with Cane and Hand in Neutral  - 1 x daily - 7 x weekly - 2 sets - 10 reps - 3 sec hold - Isometric Shoulder Flexion at Wall (Mirrored)  - 1-2 x daily - 7 x weekly - 2 sets - 10 reps - 5 sec hold - Isometric Shoulder Abduction at Wall (Mirrored)  - 1-2 x daily - 7 x weekly - 2 sets - 10 reps - 5 sec hold - Isometric Shoulder Extension at Wall (Mirrored)  - 1-2 x daily - 7 x weekly - 2 sets - 10 reps - 5 sec hold - Isometric Shoulder Internal Rotation (Mirrored)  - 1-2  x daily - 7 x weekly - 2 sets - 10 reps - 5 sec hold - Isometric Shoulder External Rotation (Mirrored)  - 1-2 x daily - 7 x weekly - 2 sets - 10 reps - 5 sec hold - Seated Shoulder Flexion AAROM with Pulley Behind  - 1-2 x daily - 7 x weekly - 2 sets - 10 reps - 3 sec hold - Seated Shoulder Scaption AAROM with Pulley at Side  - 1-2 x daily - 7 x weekly - 2 sets - 10 reps - 3 sec hold - Seated Shoulder Row with Anchored Resistance  - 1 x daily - 3 x weekly - 2 sets - 10 reps - 3-5 hold hold - Seated Shoulder Extension and Scapular Retraction with Resistance  - 1 x daily - 3 x weekly - 2 sets - 10 reps - 3-5 sec hold - Seated Bent Over Shoulder Row with Dumbbells  - 1 x daily - 3 x weekly - 2 sets - 10 reps - 3 sec hold - Seated Shoulder Extension with Dumbbells  - 1 x daily - 3 x weekly - 2 sets - 10 reps - 3 sec hold - Shoulder Flexion Wall Slide with Towel  - 1 x daily - 3 x weekly - 2 sets - 10 reps - 3 sec hold - Scaption Wall Slide with Towel  - 1 x daily - 3 x weekly - 2 sets - 10 reps - 3 sec hold - Supine Single Arm Shoulder Protraction (Mirrored)  - 1 x daily - 3 x weekly - 2 sets - 10 reps - 3 sec hold - Supine Shoulder Circles (Mirrored)  - 1 x daily - 3 x weekly - 2 sets - 10 reps - 3 sec hold   ASSESSMENT:  CLINICAL IMPRESSION: Monica Rivers reports she has been trying to create her own HEP exercises - reaching up to her microwave repetitively and reach for her nightstand.  Demonstrated cabinet reaches for functional strengthening and explained self-progression for home.  Monica Rivers continues to fatigue quickly with some therapeutic exercises but was able to demonstrate improving functional flexion and scaption during progression of cabinet reach exercises.  Therapeutic exercises also focusing on continued scapular stabilization as well as AAROM for FIR and FER using belt/towel as alternative for wand exercises.  Monica Rivers will benefit from continued skilled PT to address ongoing R shoulder ROM and  strength deficits to improve mobility and activity tolerance with decreased pain interference.   OBJECTIVE IMPAIRMENTS: decreased activity tolerance, decreased knowledge of condition, decreased mobility, decreased ROM, decreased strength, increased fascial restrictions, impaired perceived functional ability, increased muscle spasms, impaired flexibility, impaired UE functional use,  improper body mechanics, postural dysfunction, and pain.   ACTIVITY LIMITATIONS: carrying, lifting, sleeping, bed mobility, bathing, toileting, dressing, self feeding, reach over head, hygiene/grooming, and caring for others  PARTICIPATION LIMITATIONS: meal prep, cleaning, laundry, driving, shopping, community activity, and yard work  PERSONAL FACTORS: Age, Fitness, Past/current experiences, Time since onset of injury/illness/exacerbation, and 3+ comorbidities: Ischemic CVA 06/2020; HTN; prediabetes; CKD-stage 3; anxiety; GERD  are also affecting patient's functional outcome.   REHAB POTENTIAL: Good  CLINICAL DECISION MAKING: Evolving/moderate complexity  EVALUATION COMPLEXITY: Moderate   GOALS: Goals reviewed with patient? Yes  SHORT TERM GOALS: Target date: 12/26/2022  Patient will be independent with initial HEP to improve outcomes and carryover.  Baseline: Initial HEP for AAROM provided on eval Goal status: MET - 12/26/22  LONG TERM GOALS: Target date: 01/23/2023, extended to 04/05/2023  Patient will be independent with ongoing/advanced HEP for self-management at home.  Baseline:  Goal status: IN PROGRESS - 02/08/23 - Met for current HEP   2.  Patient will report 50-75% improvement in R shoulder pain to improve QOL.  Baseline: Up to 6-7/10 Goal status: MET - 02/08/23 - 80% improvement in pain  3.  Patient to demonstrate improved upright posture with posterior shoulder girdle engaged to promote improved glenohumeral joint mobility. Baseline: Forward head and rounded shoulders with mildly protracted right  shoulder Goal status: IN PROGRESS - 02/08/23 - pt continued to require frequent cues or self-correction for posture  4.  Patient to improve R shoulder AROM to [facility] without pain provocation to allow for increased ease of ADLs.  Baseline: Refer to above UE ROM table Goal status: IN PROGRESS - 02/08/23 - R shoulder ROM improving but still limited functionally, esp with abduction and FIR/FER  5.  Patient will demonstrate improved R shoulder strength to >/= 4/5 in available ROM for functional UE use. Baseline: Refer to above UE MMT table Goal status: PARTIALLY MET - 02/08/23  6  Patient will report </= 40% on QuickDASH to demonstrate improved functional ability.  Baseline: 50.0 / 100 = 50.0 % Goal status: IN PROGRESS - 02/08/23 -  40.9 / 100 = 40.9 %  7.  Patient will report ability to don/doff shirt over her head without limitation due to R shoulder pain or LOM. Baseline: Patient unable to wear a T-shirt that she has to put over her head. Goal status: MET - 02/08/23   8.  Patient will report ability to wash her hair without need for her husband's assistance. Baseline: Patient's husband currently helping her to wash her hair. Goal status:  MET - 02/08/23   9.  Patient will be able to reach to her nightstand to pick up a book w/o increased pain. Baseline:  Goal status: INITIAL - 02/08/23  PLAN:  PT FREQUENCY: 1x/week - per patient preference due to transportation issues and discomfort being in open spaces  PT DURATION: 8 weeks  PLANNED INTERVENTIONS: Therapeutic exercises, Therapeutic activity, Patient/Family education, Self Care, Joint mobilization, Aquatic Therapy, Dry Needling, Electrical stimulation, Cryotherapy, Moist heat, Taping, Vasopneumatic device, Ultrasound, Ionotophoresis 4mg /ml Dexamethasone, Manual therapy, and Re-evaluation  PLAN FOR NEXT SESSION:  Gentle R shoulder AA/AROM; R shoulder gentle isometrics progressing to reactive isometrics vs band resisted AROM as tolerated;  postural strengthening; review/update HEP as indicated  *Pt prefers to remain in a individual treatment room with lights dimmed whenever possible*   Marry Guan, PT 02/19/2023, 6:24 PM

## 2023-02-26 ENCOUNTER — Ambulatory Visit: Payer: Medicare Other | Admitting: Physical Therapy

## 2023-03-01 ENCOUNTER — Inpatient Hospital Stay: Payer: Medicare Other

## 2023-03-01 ENCOUNTER — Inpatient Hospital Stay: Payer: Medicare Other | Admitting: Hematology and Oncology

## 2023-03-01 ENCOUNTER — Inpatient Hospital Stay: Payer: Medicare Other | Attending: Hematology and Oncology

## 2023-03-01 ENCOUNTER — Encounter: Payer: Self-pay | Admitting: Hematology and Oncology

## 2023-03-01 VITALS — BP 136/65 | HR 76 | Resp 16

## 2023-03-01 VITALS — BP 141/76 | HR 80 | Temp 97.6°F | Resp 18 | Ht 63.5 in | Wt 178.6 lb

## 2023-03-01 DIAGNOSIS — D751 Secondary polycythemia: Secondary | ICD-10-CM

## 2023-03-01 DIAGNOSIS — D471 Chronic myeloproliferative disease: Secondary | ICD-10-CM

## 2023-03-01 LAB — CBC WITH DIFFERENTIAL/PLATELET
Abs Immature Granulocytes: 0.01 10*3/uL (ref 0.00–0.07)
Basophils Absolute: 0.1 10*3/uL (ref 0.0–0.1)
Basophils Relative: 1 %
Eosinophils Absolute: 0.4 10*3/uL (ref 0.0–0.5)
Eosinophils Relative: 7 %
HCT: 46.5 % — ABNORMAL HIGH (ref 36.0–46.0)
Hemoglobin: 15.7 g/dL — ABNORMAL HIGH (ref 12.0–15.0)
Immature Granulocytes: 0 %
Lymphocytes Relative: 22 %
Lymphs Abs: 1.2 10*3/uL (ref 0.7–4.0)
MCH: 28.3 pg (ref 26.0–34.0)
MCHC: 33.8 g/dL (ref 30.0–36.0)
MCV: 83.8 fL (ref 80.0–100.0)
Monocytes Absolute: 0.6 10*3/uL (ref 0.1–1.0)
Monocytes Relative: 11 %
Neutro Abs: 3.3 10*3/uL (ref 1.7–7.7)
Neutrophils Relative %: 59 %
Platelets: 169 10*3/uL (ref 150–400)
RBC: 5.55 MIL/uL — ABNORMAL HIGH (ref 3.87–5.11)
RDW: 14.4 % (ref 11.5–15.5)
WBC: 5.6 10*3/uL (ref 4.0–10.5)
nRBC: 0 % (ref 0.0–0.2)

## 2023-03-01 NOTE — Assessment & Plan Note (Signed)
Her hemoglobin fluctuates up and down Based on my review, I anticipate she will need phlebotomy every 4 months I will place threshold for phlebotomy at 15 She will receive phlebotomy today We discussed risk and benefits of starting her on hydroxyurea but the patient declined for now

## 2023-03-01 NOTE — Progress Notes (Signed)
[facility] OFFICE PROGRESS NOTE  Patient Care Team: Eather Colas, FNP as PCP - General (Nurse Practitioner) Ihor Austin, NP as Nurse Practitioner (Neurology)  ASSESSMENT & PLAN:  MPN (myeloproliferative neoplasm) (HCC) Her hemoglobin fluctuates up and down Based on my review, I anticipate she will need phlebotomy every 4 months I will place threshold for phlebotomy at 15 She will receive phlebotomy today We discussed risk and benefits of starting her on hydroxyurea but the patient declined for now  Iron overload She has evidence of iron overload Genetic test ruled out inheritable hemochromatosis We discussed the role of phlebotomy and she agreed Repeat ferritin level in August showed her ferritin is less than 100 I plan to repeat ferritin level again next year  Orders Placed This Encounter  Procedures   Ferritin    Standing Status:   Future    Standing Expiration Date:   02/29/2024    All questions were answered. The patient knows to call the clinic with any problems, questions or concerns. The total time spent in the appointment was 20 minutes encounter with patients including review of chart and various tests results, discussions about plan of care and coordination of care plan   Artis Delay, MD 03/01/2023 1:08 PM  INTERVAL HISTORY: Please see below for problem oriented charting. she returns for treatment and follow-up She is scheduled for phlebotomy today Since last time I saw her, she is doing well and continuing physical therapy She noted significant skin bruises No recent bleeding We discussed test results and timing of her next treatment  REVIEW OF SYSTEMS:   Constitutional: Denies fevers, chills or abnormal weight loss Eyes: Denies blurriness of vision Ears, nose, mouth, throat, and face: Denies mucositis or sore throat Respiratory: Denies cough, dyspnea or wheezes Cardiovascular: Denies palpitation, chest discomfort or lower extremity  swelling Gastrointestinal:  Denies nausea, heartburn or change in bowel habits Skin: Denies abnormal skin rashes Lymphatics: Denies new lymphadenopathy  Neurological:Denies numbness, tingling or new weaknesses Behavioral/Psych: Mood is stable, no new changes  All other systems were reviewed with the patient and are negative.  I have reviewed the past medical history, past surgical history, social history and family history with the patient and they are unchanged from previous note.  ALLERGIES:  is allergic to morphine and codeine, other, codeine, and penicillin g.  MEDICATIONS:  Current Outpatient Medications  Medication Sig Dispense Refill   cholecalciferol (VITAMIN D3) 25 MCG (1000 UNIT) tablet Take 2,000 Units by mouth daily.     clopidogrel (PLAVIX) 75 MG tablet Take 1 tablet (75 mg total) by mouth daily. Will need refills from PCP 30 tablet 0   LORazepam (ATIVAN) 0.5 MG tablet Take by mouth.     losartan-hydrochlorothiazide (HYZAAR) 100-25 MG tablet Take 1 tablet by mouth daily.     pantoprazole (PROTONIX) 20 MG tablet Take 20 mg by mouth daily.     No current facility-administered medications for this visit.    SUMMARY OF ONCOLOGIC HISTORY:  She had a stroke approximately a year and a half ago and at the time of evaluation/admission, she was noted to have high hemoglobin The patient had 50+ pack year of smoking, only quit since the stroke diagnosis Since her history of stroke, she has residual dizziness, visual difficulties and balance problems.  She denies recent falls She had extensive evaluation by her primary care doctor recently and was noted to have several abnormal blood work including persistent polycythemia as well as abnormal iron studies.  She  was also noted to have abnormal liver function and had ultrasound evaluation.  Iron studies was noted to be grossly abnormal  She denies intermittent headaches, shortness of breath on exertion, frequent leg cramps or chest pain.    There is no prior diagnosis of obstructive sleep apnea. The patient denies weight loss or skin itching. 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 Genetic test for hemochromatosis is negative CT chest screening for lung cancer is neg in Dec 2023 The patient received intermittent phlebotomy in 2024  PHYSICAL EXAMINATION: ECOG PERFORMANCE STATUS: 0 - Asymptomatic  Vitals:   03/01/23 1256  BP: (!) 141/76  Pulse: 80  Resp: 18  Temp: 97.6 F (36.4 C)  SpO2: 97%   Filed Weights   03/01/23 1256  Weight: 178 lb 9.6 oz (81 kg)    GENERAL:alert, no distress and comfortable SKIN: Noted skin bruises  LABORATORY DATA:  I have reviewed the data as listed    Component Value Date/Time   NA 136 11/07/2022 1156   K 3.7 11/07/2022 1156   CL 101 11/07/2022 1156   CO2 28 11/07/2022 1156   GLUCOSE 203 (H) 11/07/2022 1156   BUN 30 (H) 11/07/2022 1156   CREATININE 1.17 (H) 11/07/2022 1156   CALCIUM 11.0 (H) 11/07/2022 1156   CALCIUM 9.1 07/03/2022 1422   PROT 6.8 11/07/2022 1156   ALBUMIN 4.2 11/07/2022 1156   AST 71 (H) 11/07/2022 1156   ALT 92 (H) 11/07/2022 1156   ALKPHOS 85 11/07/2022 1156   BILITOT 0.6 11/07/2022 1156   GFRNONAA 48 (L) 11/07/2022 1156    No results found for: "SPEP", "UPEP"  Lab Results  Component Value Date   WBC 5.6 03/01/2023   NEUTROABS 3.3 03/01/2023   HGB 15.7 (H) 03/01/2023   HCT 46.5 (H) 03/01/2023   MCV 83.8 03/01/2023   PLT 169 03/01/2023      Chemistry      Component Value Date/Time   NA 136 11/07/2022 1156   K 3.7 11/07/2022 1156   CL 101 11/07/2022 1156   CO2 28 11/07/2022 1156   BUN 30 (H) 11/07/2022 1156   CREATININE 1.17 (H) 11/07/2022 1156      Component Value Date/Time   CALCIUM 11.0 (H) 11/07/2022 1156   CALCIUM 9.1 07/03/2022 1422   ALKPHOS 85 11/07/2022 1156   AST 71 (H) 11/07/2022 1156   ALT 92 (H) 11/07/2022 1156   BILITOT 0.6 11/07/2022 1156

## 2023-03-01 NOTE — Assessment & Plan Note (Signed)
She has evidence of iron overload Genetic test ruled out inheritable hemochromatosis We discussed the role of phlebotomy and she agreed Repeat ferritin level in August showed her ferritin is less than 100 I plan to repeat ferritin level again next year

## 2023-03-01 NOTE — Progress Notes (Signed)
Monica Rivers presents today for phlebotomy per MD orders. Phlebotomy procedure started at 1346 and ended at 1355. 524 grams removed. Phlebotomy kit used. Patient observed for 30 minutes after procedure without any incident. Patient tolerated procedure well. IV needle removed intact. Food and fluids offered and fluids taken.

## 2023-03-01 NOTE — Patient Instructions (Signed)

## 2023-03-05 ENCOUNTER — Encounter: Payer: Self-pay | Admitting: Physical Therapy

## 2023-03-05 ENCOUNTER — Ambulatory Visit: Payer: Medicare Other | Attending: Physician Assistant | Admitting: Physical Therapy

## 2023-03-05 DIAGNOSIS — M25611 Stiffness of right shoulder, not elsewhere classified: Secondary | ICD-10-CM | POA: Insufficient documentation

## 2023-03-05 DIAGNOSIS — M6281 Muscle weakness (generalized): Secondary | ICD-10-CM | POA: Diagnosis present

## 2023-03-05 DIAGNOSIS — M25511 Pain in right shoulder: Secondary | ICD-10-CM | POA: Diagnosis present

## 2023-03-05 DIAGNOSIS — R293 Abnormal posture: Secondary | ICD-10-CM | POA: Insufficient documentation

## 2023-03-05 NOTE — Therapy (Signed)
OUTPATIENT PHYSICAL THERAPY TREATMENT     Patient Name: Monica Rivers MRN: [id number] DOB:Sep 15, 1945, 77 y.o., female Today's Date: 03/05/2023   END OF SESSION:  PT End of Session - 03/05/23 1314     Visit Number 10    Date for PT Re-Evaluation 04/05/23    Authorization Type Medicare & [employer]    Progress Note Due on Visit 18   Recert on visit #8 - 02/08/23   PT Start Time 1314    PT Stop Time 1400    PT Time Calculation (min) 46 min    Activity Tolerance Patient tolerated treatment well    Behavior During Therapy [facility] for tasks assessed/performed                   Past Medical History:  Diagnosis Date   Acute ischemic stroke (HCC) 07/19/2020   Anxiety 08/05/2015   CKD (chronic kidney disease) stage 3, GFR 30-59 ml/min (HCC) 08/14/2016   GERD (gastroesophageal reflux disease) 01/31/2016   Hypertension 08/05/2015   Pre-diabetes 08/05/2015   Vitamin D insufficiency 08/28/2016   History reviewed. No pertinent surgical history. 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 02/20/2022   Acute ischemic stroke (HCC) 07/19/2020   Statin medication declined by patient 01/05/2020   Vitamin D insufficiency 08/28/2016   CKD (chronic kidney disease) stage 3, GFR 30-59 ml/min (HCC) 08/14/2016   GERD (gastroesophageal reflux disease) 01/31/2016   Anxiety 08/05/2015   Hypertension 08/05/2015   Pre-diabetes 08/05/2015    PCP: Eather Colas, FNP   REFERRING PROVIDER: Colvin Caroli, PA-C  REFERRING DIAG: [phone] (ICD-10-CM) - Unspecified fracture of upper end of right humerus, subsequent encounter for fracture with routine  healing   THERAPY DIAG:  Stiffness of right shoulder, not elsewhere classified  Acute pain of right shoulder  Muscle weakness (generalized)  Abnormal posture  RATIONALE FOR EVALUATION AND TREATMENT: Rehabilitation  ONSET DATE: 09/24/2022  NEXT MD VISIT: None scheduled   SUBJECTIVE:                                                                                                                                                                                                         SUBJECTIVE STATEMENT: Pt reports improvement in reaching  her microwave and her bedside table but still struggles with over her shoulder or behind her back motions.  Pain less frequent and only up to 2-3/10, usually in the middle of the night - 90% improved.  EVAL: Pt reports she tripped over her dog on 09/24/2022 and fell on the tile floor landing on her L side but fracturing her R proximal humerus (not sure how she did it). No surgery but immobilized until last week. She reports she is R handed and having to learn to do things L handed sometimes has caused her more pain. Uses SPC when going out since her CVA, but does not use the cane at home.  PAIN: Are you having pain? Yes: NPRS scale: up to 2-3/10 Pain location: R shoulder joint & upper arm Pain description: intermittently uncomfortable  Aggravating factors: car trip (vibration?), movement away from body (putting on deodorant)  Relieving factors: having her husband rub her arm/shoulder  PERTINENT HISTORY:  Ischemic CVA 06/2020; HTN; prediabetes; CKD-stage 3; anxiety; GERD  PRECAUTIONS: None  RED FLAGS: None  HAND DOMINANCE: Right  WEIGHT BEARING RESTRICTIONS: No  FALLS:  Has patient fallen in last 6 months? Yes. 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 Also be able to feed herself easier and run the vacuum."   OBJECTIVE: (objective measures completed at initial evaluation unless otherwise dated)  DIAGNOSTIC FINDINGS:  11/22/22 - R shoulder x-ray: reviewed/interpreted ap/outlet views of right shoulder showing healing proximal humerus fracture in good position - recommend physical therapy for the next 4 weeks then transition to HEP - pt in agreement - may d/c sling f/u in 4 weeks - for recheck if needed   10/25/22 - R shoulder x-ray: ap/outlet views of right shoulder showing healing greater tuberosity fracture in good position and placement - recommend gentle physical therapy to work on rom and isometrics - f/u in 4 weeks for recheck   PATIENT SURVEYS:  Quick Dash 50.0 / 100 = 50.0 % ; 40.9 / 100 = 40.9 % (02/08/23)  COGNITION: Overall cognitive status: Within functional limits for tasks assessed     SENSATION: WFL  POSTURE: rounded shoulders and forward head  UPPER EXTREMITY ROM:   Active ROM Right eval Left eval R 01/02/23 R 02/08/23  Shoulder flexion 77 141 86 108 A ^ 127 AA  Shoulder extension 34 59 40 41  Shoulder abduction 53 162 * 66 79 A ^   89 P  Shoulder adduction      Shoulder internal rotation FIR lateral buttock FIR WFL  FIR lateral buttock  Shoulder external rotation 30 FER unable 72 FER T4 40 48 ^  FER upper shoulder  (Blank rows = not tested, ^ - pain/discomfort)  UPPER EXTREMITY MMT:  MMT Right eval Left eval R* 02/08/23 L 02/08/23  Shoulder flexion 2- 5 3+ 5  Shoulder extension 2- 5 4 5   Shoulder abduction 2- 5 3+ 5  Shoulder adduction      Shoulder internal rotation 2- 5 4+ 5  Shoulder external rotation 2- 4 4 4  Middle trapezius      Lower trapezius       Elbow flexion      Elbow extension      Wrist flexion      Wrist extension      Wrist ulnar deviation      Wrist radial deviation      Wrist pronation      Wrist supination      Grip strength (lbs)      (Blank rows = not tested, * - for available ROM)  PALPATION:  Increased muscle tension and TTP in R pecs and anterolateral deltoids    TODAY'S TREATMENT:   03/05/23 THERAPEUTIC EXERCISE: to improve flexibility, strength and mobility.  Demonstration, verbal and tactile cues throughout for technique. Seated pulleys: Flexion and scaption x 3 min each S/L sleeper stretch for R shoulder IR & ER 10 x 3" each, 2 sets  MANUAL THERAPY: To promote normalized muscle tension, improved flexibility, improved joint mobility, increased ROM, and reduced pain. STM/DTM and manual TPR to R anterolateral deltoids R shoulder gentle distraction +/- CW/CCW oscillations to promote muscle relaxation Contract/relax into R shoulder IR & ER   02/19/23 THERAPEUTIC EXERCISE: to improve flexibility, strength and mobility.  Demonstration, verbal and tactile cues throughout for technique. Seated pulleys: Flexion and scaption x 3 min each Cabinet reaches: R shoulder Flexion with Free-Up tub (16 oz - 3/4 full) to 1st shelf 2 x 10 R shoulder Flexion fingertip touches to 2nd shelf, progressing to 4 oz putty tub, then Free-Up tub R shoulder Scaption fingertip touches to 1st shelf, progressing to 4 oz putty tub, then Free-Up tub Towel assisted R shoulder FER & FIR AAROM 2 x 10 each R shoulder CW/CCW circles at 90 flexion 2 x 5 each, 1 set each with towel and ball on wall  R shoulder CW/CCW circles at 90 scaption 2 x 5 each, 1 set each with towel and ball on wall    02/08/23 THERAPEUTIC EXERCISE: to improve flexibility, strength and mobility.  Demonstration, verbal and tactile cues throughout for technique. Seated pulleys: Flexion and scaption x 3 min each Seated R shoulder flexion AROM x 5 Seated R shoulder  flexion AAROM concentric with eccentric AROM x 5 - patient able to achieve higher degree of elevation in flexion Standing R shoulder wand IR: Drawing wand up back x 10 Drawing hand across back x 10  THERAPEUTIC ACTIVITIES: QuickDASH: 40.9 / 100 = 40.9 % R shoulder ROM assessment UE MMT Goal assessment   PATIENT EDUCATION:  Education details: progress with PT, ongoing PT POC, and continue with current HEP  Person educated: Patient Education method: Explanation Education comprehension: verbalized understanding and needs further education  HOME EXERCISE PROGRAM: Access Code: [email or web address] URL: [email or web address] Date: 02/19/2023 Prepared by: Glenetta Hew  Exercises - Seated Scapular Retraction  - 3 x daily - 7 x weekly - 2 sets - 10 reps - 5 sec hold - Seated Shoulder Flexion Extension AAROM with Dowel into Wall (Mirrored)  - 2 x daily - 7 x weekly - 2 sets - 10 reps - 3 sec hold - Supine Shoulder Flexion AAROM with Dowel (Mirrored)  - 1 x daily - 7 x weekly - 2 sets - 10 reps - 3 sec hold - Supine Shoulder External Rotation in 45 Degrees Abduction AAROM with Dowel (Mirrored)  - 1 x daily - 7 x weekly - 2 sets - 10 reps - 3 sec hold - Seated Shoulder External Rotation AAROM with Cane and Hand  in Neutral  - 1 x daily - 7 x weekly - 2 sets - 10 reps - 3 sec hold - Isometric Shoulder Flexion at Wall (Mirrored)  - 1-2 x daily - 7 x weekly - 2 sets - 10 reps - 5 sec hold - Isometric Shoulder Abduction at Wall (Mirrored)  - 1-2 x daily - 7 x weekly - 2 sets - 10 reps - 5 sec hold - Isometric Shoulder Extension at Wall (Mirrored)  - 1-2 x daily - 7 x weekly - 2 sets - 10 reps - 5 sec hold - Isometric Shoulder Internal Rotation (Mirrored)  - 1-2 x daily - 7 x weekly - 2 sets - 10 reps - 5 sec hold - Isometric Shoulder External Rotation (Mirrored)  - 1-2 x daily - 7 x weekly - 2 sets - 10 reps - 5 sec hold - Seated Shoulder Flexion AAROM with Pulley Behind  - 1-2 x daily - 7 x  weekly - 2 sets - 10 reps - 3 sec hold - Seated Shoulder Scaption AAROM with Pulley at Side  - 1-2 x daily - 7 x weekly - 2 sets - 10 reps - 3 sec hold - Seated Shoulder Row with Anchored Resistance  - 1 x daily - 3 x weekly - 2 sets - 10 reps - 3-5 hold hold - Seated Shoulder Extension and Scapular Retraction with Resistance  - 1 x daily - 3 x weekly - 2 sets - 10 reps - 3-5 sec hold - Seated Bent Over Shoulder Row with Dumbbells  - 1 x daily - 3 x weekly - 2 sets - 10 reps - 3 sec hold - Seated Shoulder Extension with Dumbbells  - 1 x daily - 3 x weekly - 2 sets - 10 reps - 3 sec hold - Shoulder Flexion Wall Slide with Towel  - 1 x daily - 3 x weekly - 2 sets - 10 reps - 3 sec hold - Scaption Wall Slide with Towel  - 1 x daily - 3 x weekly - 2 sets - 10 reps - 3 sec hold - Supine Single Arm Shoulder Protraction (Mirrored)  - 1 x daily - 3 x weekly - 2 sets - 10 reps - 3 sec hold - Supine Shoulder Circles (Mirrored)  - 1 x daily - 3 x weekly - 2 sets - 10 reps - 3 sec hold   ASSESSMENT:  CLINICAL IMPRESSION: Chaz reports 90% improvement in her pain - pain now typically only at night and ony up to 2-3/10.  She reports improvement in functional reach to her microwave and nightstand, but notes continued limitation with reaching behind her back or over her shoulder such as when trying to curl her hair.  She notes that she has been feeling a tight band in her R anterior shoulder and upon palpation multiple taut bands identified in R anterolateral deltoid.  These were addressed with STM/DTM and manual TPR/XFM with patient noting good relief of pain and muscle tension.  Introduced Psychologist, clinical for R shoulder IR and ER as patient noting limited progress with current FIR and FER stretching using belt or prior wand AAROM/stretching.  Jahda will benefit from continued skilled PT to address ongoing R shoulder ROM and strength deficits to improve mobility and activity tolerance with decreased pain  interference.   OBJECTIVE IMPAIRMENTS: decreased activity tolerance, decreased knowledge of condition, decreased mobility, decreased ROM, decreased strength, increased fascial restrictions, impaired perceived functional ability, increased muscle spasms, impaired flexibility,  impaired UE functional use, improper body mechanics, postural dysfunction, and pain.   ACTIVITY LIMITATIONS: carrying, lifting, sleeping, bed mobility, bathing, toileting, dressing, self feeding, reach over head, hygiene/grooming, and caring for others  PARTICIPATION LIMITATIONS: meal prep, cleaning, laundry, driving, shopping, community activity, and yard work  PERSONAL FACTORS: Age, Fitness, Past/current experiences, Time since onset of injury/illness/exacerbation, and 3+ comorbidities: Ischemic CVA 06/2020; HTN; prediabetes; CKD-stage 3; anxiety; GERD  are also affecting patient's functional outcome.   REHAB POTENTIAL: Good  CLINICAL DECISION MAKING: Evolving/moderate complexity  EVALUATION COMPLEXITY: Moderate   GOALS: Goals reviewed with patient? Yes  SHORT TERM GOALS: Target date: 12/26/2022  Patient will be independent with initial HEP to improve outcomes and carryover.  Baseline: Initial HEP for AAROM provided on eval Goal status: MET - 12/26/22  LONG TERM GOALS: Target date: 01/23/2023, extended to 04/05/2023  Patient will be independent with ongoing/advanced HEP for self-management at home.  Baseline:  Goal status: IN PROGRESS - 02/08/23 - Met for current HEP   2.  Patient will report 50-75% improvement in R shoulder pain to improve QOL.  Baseline: Up to 6-7/10 Goal status: MET - 02/08/23 - 80% improvement in pain; 03/05/23 - 90% improvement  3.  Patient to demonstrate improved upright posture with posterior shoulder girdle engaged to promote improved glenohumeral joint mobility. Baseline: Forward head and rounded shoulders with mildly protracted right shoulder Goal status: IN PROGRESS - 02/08/23 - pt  continued to require frequent cues or self-correction for posture  4.  Patient to improve R shoulder AROM to [facility] without pain provocation to allow for increased ease of ADLs.  Baseline: Refer to above UE ROM table Goal status: IN PROGRESS - 02/08/23 - R shoulder ROM improving but still limited functionally, esp with abduction and FIR/FER  5.  Patient will demonstrate improved R shoulder strength to >/= 4/5 in available ROM for functional UE use. Baseline: Refer to above UE MMT table Goal status: PARTIALLY MET - 02/08/23  6  Patient will report </= 40% on QuickDASH to demonstrate improved functional ability.  Baseline: 50.0 / 100 = 50.0 % Goal status: IN PROGRESS - 02/08/23 -  40.9 / 100 = 40.9 %  7.  Patient will report ability to don/doff shirt over her head without limitation due to R shoulder pain or LOM. Baseline: Patient unable to wear a T-shirt that she has to put over her head. Goal status: MET - 02/08/23   8.  Patient will report ability to wash her hair without need for her husband's assistance. Baseline: Patient's husband currently helping her to wash her hair. Goal status:  MET - 02/08/23   9.  Patient will be able to reach to her nightstand to pick up a book w/o increased pain. Baseline:  Goal status: IN PROGRESS - 03/05/23 - improving ability to reach nightstand  PLAN:  PT FREQUENCY: 1x/week - per patient preference due to transportation issues and discomfort being in open spaces  PT DURATION: 8 weeks  PLANNED INTERVENTIONS: Therapeutic exercises, Therapeutic activity, Patient/Family education, Self Care, Joint mobilization, Aquatic Therapy, Dry Needling, Electrical stimulation, Cryotherapy, Moist heat, Taping, Vasopneumatic device, Ultrasound, Ionotophoresis 4mg /ml Dexamethasone, Manual therapy, and Re-evaluation  PLAN FOR NEXT SESSION:  Gentle R shoulder AA/AROM; R shoulder gentle isometrics progressing to reactive isometrics vs band resisted AROM as tolerated;  postural strengthening; review/update HEP as indicated  *Pt prefers to remain in a individual treatment room with lights dimmed whenever possible*   Marry Guan, PT 03/05/2023, 6:06 PM

## 2023-03-12 ENCOUNTER — Ambulatory Visit: Payer: Medicare Other | Admitting: Physical Therapy

## 2023-03-19 ENCOUNTER — Encounter: Payer: Medicare Other | Admitting: Physical Therapy

## 2023-04-05 ENCOUNTER — Encounter: Payer: Self-pay | Admitting: Physical Therapy

## 2023-04-05 ENCOUNTER — Ambulatory Visit: Payer: Medicare Other | Attending: Physician Assistant | Admitting: Physical Therapy

## 2023-04-05 DIAGNOSIS — M6281 Muscle weakness (generalized): Secondary | ICD-10-CM | POA: Diagnosis present

## 2023-04-05 DIAGNOSIS — M25611 Stiffness of right shoulder, not elsewhere classified: Secondary | ICD-10-CM | POA: Diagnosis present

## 2023-04-05 DIAGNOSIS — R293 Abnormal posture: Secondary | ICD-10-CM

## 2023-04-05 DIAGNOSIS — M25511 Pain in right shoulder: Secondary | ICD-10-CM | POA: Diagnosis present

## 2023-04-05 NOTE — Therapy (Signed)
 OUTPATIENT PHYSICAL THERAPY TREATMENT / DISCHARGE SUMMARY    Patient Name: Monica Rivers MRN: [id number] DOB:1945-04-28, 78 y.o., female Today's Date: 04/05/2023   END OF SESSION:  PT End of Session - 04/05/23 1314     Visit Number 11    Date for PT Re-Evaluation 04/05/23    Authorization Type Medicare & [employer]    Progress Note Due on Visit 18   Recert on visit #8 - 02/08/23   PT Start Time 1314    PT Stop Time 1358    PT Time Calculation (min) 44 min    Activity Tolerance Patient tolerated treatment well    Behavior During Therapy [facility] for tasks assessed/performed                   Past Medical History:  Diagnosis Date   Acute ischemic stroke (HCC) 07/19/2020   Anxiety 08/05/2015   CKD (chronic kidney disease) stage 3, GFR 30-59 ml/min (HCC) 08/14/2016   GERD (gastroesophageal reflux disease) 01/31/2016   Hypertension 08/05/2015   Pre-diabetes 08/05/2015   Vitamin D insufficiency 08/28/2016   History reviewed. No pertinent surgical history. 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 02/20/2022   Acute ischemic stroke (HCC) 07/19/2020   Statin medication declined by patient 01/05/2020   Vitamin D insufficiency 08/28/2016   CKD (chronic kidney disease) stage 3, GFR 30-59 ml/min (HCC) 08/14/2016   GERD (gastroesophageal reflux disease) 01/31/2016   Anxiety 08/05/2015   Hypertension 08/05/2015   Pre-diabetes 08/05/2015    PCP: Katrinka Duwaine LABOR, FNP   REFERRING PROVIDER: Melvenia Debby Cain, PA-C  REFERRING DIAG: [id number] (ICD-10-CM) - Unspecified fracture of upper end of right humerus, subsequent encounter for  fracture with routine healing   THERAPY DIAG:  Stiffness of right shoulder, not elsewhere classified  Acute pain of right shoulder  Muscle weakness (generalized)  Abnormal posture  RATIONALE FOR EVALUATION AND TREATMENT: Rehabilitation  ONSET DATE: 09/24/2022  NEXT MD VISIT: None scheduled   SUBJECTIVE:                                                                                                                                                                                                         SUBJECTIVE STATEMENT: Pt requesting to  wrap up PT today, but would like to what exercises she should focus on at home.  She denies pain currently but does still experience some pain with certain motions and still notes stiffness, esp in the mornings.  EVAL: Pt reports she tripped over her dog on 09/24/2022 and fell on the tile floor landing on her L side but fracturing her R proximal humerus (not sure how she did it). No surgery but immobilized until last week. She reports she is R handed and having to learn to do things L handed sometimes has caused her more pain. Uses SPC when going out since her CVA, but does not use the cane at home.  PAIN: Are you having pain? No  PERTINENT HISTORY:  Ischemic CVA 06/2020; HTN; prediabetes; CKD-stage 3; anxiety; GERD  PRECAUTIONS: None  RED FLAGS: None  HAND DOMINANCE: Right  WEIGHT BEARING RESTRICTIONS: No  FALLS:  Has patient fallen in last 6 months? Yes. 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 Also  be able to feed herself easier and run the vacuum.   OBJECTIVE: (objective measures completed at initial evaluation unless otherwise dated)  DIAGNOSTIC FINDINGS:  11/22/22 - R shoulder x-ray: reviewed/interpreted ap/outlet views of right shoulder showing healing proximal humerus fracture in good position - recommend physical therapy for the next 4 weeks then transition to HEP - pt in agreement - may d/c sling f/u in 4 weeks - for recheck if needed   10/25/22 - R shoulder x-ray: ap/outlet views of right shoulder showing healing greater tuberosity fracture in good position and placement - recommend gentle physical therapy to work on rom and isometrics - f/u in 4 weeks for recheck   PATIENT SURVEYS:  Quick Dash 50.0 / 100 = 50.0 % ; 40.9 / 100 = 40.9 % (02/08/23); 29.5 / 100 = 29.5 % (04/05/23)  COGNITION: Overall cognitive status: Within functional limits for tasks assessed     SENSATION: WFL  POSTURE: rounded shoulders and forward head  UPPER EXTREMITY ROM:   Active ROM Right eval Left eval R 01/02/23 R 02/08/23 R 04/05/23  Shoulder flexion 77 141 86 108 A ^ 127 AA 130  Shoulder extension 34 59 40 41 49  Shoulder abduction 53 162 * 66 79 A ^   89 P 116  Shoulder adduction       Shoulder internal rotation FIR lateral buttock FIR WFL  FIR lateral buttock FIR to mid QL  Shoulder external rotation 30 FER unable 72 FER T4 40 48 ^  FER upper shoulder 64 FER T2  (Blank rows = not tested, ^ - pain/discomfort)  UPPER EXTREMITY MMT:  MMT Right eval Left eval R* 02/08/23 L 02/08/23 R 04/05/23 L 04/05/23  Shoulder flexion 2- 5 3+ 5 4 ^ 5  Shoulder extension 2- 5 4 5  4+ 5  Shoulder abduction 2- 5 3+ 5 4^ 5  Shoulder adduction        Shoulder internal rotation 2- 5 4+ 5 4+ ^ 5  Shoulder external rotation 2- 4 4 4 4  ^ 4  Middle trapezius        Lower trapezius        Elbow flexion        Elbow extension        Wrist flexion        Wrist extension        Wrist ulnar deviation        Wrist  radial deviation        Wrist pronation        Wrist supination        Grip strength (lbs)        (Blank rows = not tested, * - for available ROM, ^ = pain/discomfort)  PALPATION:  Increased muscle tension and TTP in R pecs and anterolateral deltoids    TODAY'S TREATMENT:   04/05/23 THERAPEUTIC ACTIVITIES: Shoulder ROM and MMT QuickDASH: 29.5 / 100 = 29.5 % Goal assessment  THERAPEUTIC EXERCISE: to improve flexibility, strength and mobility.  Demonstration, verbal and tactile cues throughout for technique. Review and consolidation of HEP with recommendations for frequency of ongoing performance   03/05/23 THERAPEUTIC EXERCISE: to improve flexibility, strength and mobility.  Demonstration, verbal and tactile cues throughout for technique. Seated pulleys: Flexion and scaption x 3 min each S/L sleeper stretch for R shoulder IR & ER 10 x 3 each, 2 sets  MANUAL THERAPY: To promote normalized muscle tension, improved flexibility, improved joint mobility, increased ROM, and reduced pain. STM/DTM and manual TPR to R anterolateral deltoids R shoulder gentle distraction +/- CW/CCW oscillations to promote muscle relaxation Contract/relax into R shoulder IR & ER   02/19/23 THERAPEUTIC EXERCISE: to improve flexibility, strength and mobility.  Demonstration, verbal and tactile cues throughout for technique. Seated pulleys: Flexion and scaption x 3 min each Cabinet reaches: R shoulder Flexion with Free-Up tub (16 oz - 3/4 full) to 1st shelf 2 x 10 R shoulder Flexion fingertip touches to 2nd shelf, progressing to 4 oz putty tub, then Free-Up tub R shoulder Scaption fingertip touches to 1st shelf, progressing to 4 oz putty tub, then Free-Up tub Towel assisted R shoulder FER & FIR AAROM 2 x 10 each R shoulder CW/CCW circles at 90 flexion 2 x 5 each, 1 set each with towel and ball on wall  R shoulder CW/CCW circles at 90 scaption 2 x 5 each, 1 set each with towel and ball on wall     02/08/23 THERAPEUTIC EXERCISE: to improve flexibility, strength and mobility.  Demonstration, verbal and tactile cues throughout for technique. Seated pulleys: Flexion and scaption x 3 min each Seated R shoulder flexion AROM x 5 Seated R shoulder flexion AAROM concentric with eccentric AROM x 5 - patient able to achieve higher degree of elevation in flexion Standing R shoulder wand IR: Drawing wand up back x 10 Drawing hand across back x 10  THERAPEUTIC ACTIVITIES: QuickDASH: 40.9 / 100 = 40.9 % R shoulder ROM assessment UE MMT Goal assessment   PATIENT EDUCATION:  Education details: HEP review, HEP consolidation, and recommended frequency for ongoing HEP at discharge to prevent loss of gains achieved with PT  Person educated: Patient Education method: Explanation Education comprehension: verbalized understanding  HOME EXERCISE PROGRAM: Access Code: [email or web address] URL: [email or web address] Date: 04/05/2023 Prepared by: Elijah Hidden  Exercises - Seated Scapular Retraction  - 3 x daily - 7 x weekly - 2 sets - 10 reps - 5 sec hold - Supine Shoulder Flexion AAROM with Dowel (Mirrored)  - 1 x daily - 7 x weekly - 2 sets -  10 reps - 3 sec hold - Seated Shoulder External Rotation AAROM with Cane and Hand in Neutral  - 1 x daily - 7 x weekly - 2 sets - 10 reps - 3 sec hold - Seated Shoulder Flexion AAROM with Pulley Behind  - 1-2 x daily - 7 x weekly - 2 sets - 10 reps - 3 sec hold - Seated Shoulder Scaption AAROM with Pulley at Side  - 1-2 x daily - 7 x weekly - 2 sets - 10 reps - 3 sec hold - Seated Shoulder Row with Anchored Resistance  - 1 x daily - 3 x weekly - 2 sets - 10 reps - 3-5 hold hold - Seated Shoulder Extension and Scapular Retraction with Resistance  - 1 x daily - 3 x weekly - 2 sets - 10 reps - 3-5 sec hold - Shoulder Flexion Wall Slide with Towel  - 1 x daily - 3 x weekly - 2 sets - 10 reps - 3 sec hold - Scaption Wall Slide with Towel  - 1 x daily - 3 x  weekly - 2 sets - 10 reps - 3 sec hold - Supine Single Arm Shoulder Protraction (Mirrored)  - 1 x daily - 3 x weekly - 2 sets - 10 reps - 3 sec hold - Supine Shoulder Circles (Mirrored)  - 1 x daily - 3 x weekly - 2 sets - 10 reps - 3 sec hold   ASSESSMENT:  CLINICAL IMPRESSION: Dola returns to PT for the 1st time in a month and requests that today be her last visit.  She reports 90+% improvement in her pain - pain now typically only at night or with reaching in some directions.  QuickDASH demonstrates decreasing disability to 29.5 % from 50% on eval.  R shoulder ROM now grossly [facility] for most motions with greatest limitation in FIR.  R shoulder strength is now >/= 4/5 although some pain still noted with certain resisted motions.  HEP reviewed and consolidated with ideas provided on how to continue to utilize functional activities as therapeutic exercises to further improve her ROM, strength and activity tolerance.  All PT goals now met or partially met and Tearra feels ready to transition to her HEP, therefore will proceed with discharge from physical therapy for this episode.   OBJECTIVE IMPAIRMENTS: decreased activity tolerance, decreased knowledge of condition, decreased mobility, decreased ROM, decreased strength, increased fascial restrictions, impaired perceived functional ability, increased muscle spasms, impaired flexibility, impaired UE functional use, improper body mechanics, postural dysfunction, and pain.   ACTIVITY LIMITATIONS: carrying, lifting, sleeping, bed mobility, bathing, toileting, dressing, self feeding, reach over head, hygiene/grooming, and caring for others  PARTICIPATION LIMITATIONS: meal prep, cleaning, laundry, driving, shopping, community activity, and yard work  PERSONAL FACTORS: Age, Fitness, Past/current experiences, Time since onset of injury/illness/exacerbation, and 3+ comorbidities: Ischemic CVA 06/2020; HTN; prediabetes; CKD-stage 3; anxiety; GERD  are also  affecting patient's functional outcome.   REHAB POTENTIAL: Good  CLINICAL DECISION MAKING: Evolving/moderate complexity  EVALUATION COMPLEXITY: Moderate   GOALS: Goals reviewed with patient? Yes  SHORT TERM GOALS: Target date: 12/26/2022  Patient will be independent with initial HEP to improve outcomes and carryover.  Baseline: Initial HEP for AAROM provided on eval Goal status: MET - 12/26/22  LONG TERM GOALS: Target date: 01/23/2023, extended to 04/05/2023  Patient will be independent with ongoing/advanced HEP for self-management at home.  Baseline:  Goal status: MET - 04/05/23   2.  Patient will report 50-75% improvement in  R shoulder pain to improve QOL.  Baseline: Up to 6-7/10 Goal status: MET - 02/08/23 - 80% improvement in pain; 03/05/23 - 90% improvement  3.  Patient to demonstrate improved upright posture with posterior shoulder girdle engaged to promote improved glenohumeral joint mobility. Baseline: Forward head and rounded shoulders with mildly protracted right shoulder Goal status: MET - 04/05/23   4.  Patient to improve R shoulder AROM to [facility] without pain provocation to allow for increased ease of ADLs.  Baseline: Refer to above UE ROM table Goal status: PARTIALLY MET - 04/05/23 - R shoulder ROM improving to mostly functional but still limited and painful with FIR  5.  Patient will demonstrate improved R shoulder strength to >/= 4/5 in available ROM for functional UE use. Baseline: Refer to above UE MMT table Goal status: MET - 04/05/23  6  Patient will report </= 40% on QuickDASH to demonstrate improved functional ability.  Baseline: 50.0 / 100 = 50.0 %; 02/08/23 -  40.9 / 100 = 40.9 % Goal status: MET - 04/05/23 - 29.5 / 100 = 29.5 %  7.  Patient will report ability to don/doff shirt over her head without limitation due to R shoulder pain or LOM. Baseline: Patient unable to wear a T-shirt that she has to put over her head. Goal status: MET - 02/08/23   8.  Patient  will report ability to wash her hair without need for her husband's assistance. Baseline: Patient's husband currently helping her to wash her hair. Goal status:  MET - 02/08/23   9.  Patient will be able to reach to her nightstand to pick up a book w/o increased pain. Baseline:  Goal status: PARTIALLY MET - 04/05/23 - Pt reports continued improvement - able to pick up book from the nightstand but does note some increased pain  PLAN:  PT FREQUENCY: 1x/week - per patient preference due to transportation issues and discomfort being in open spaces  PT DURATION: 8 weeks  PLANNED INTERVENTIONS: Therapeutic exercises, Therapeutic activity, Patient/Family education, Self Care, Joint mobilization, Aquatic Therapy, Dry Needling, Electrical stimulation, Cryotherapy, Moist heat, Taping, Vasopneumatic device, Ultrasound, Ionotophoresis 4mg /ml Dexamethasone, Manual therapy, and Re-evaluation  PLAN FOR NEXT SESSION:  transition to HEP + D/C from PT    PHYSICAL THERAPY DISCHARGE SUMMARY  Visits from Start of Care: 11  Current functional level related to goals / functional outcomes: Refer to above clinical impression and goal assessment.   Remaining deficits: As above.  Pt most limited in R shoulder FIR with continued mild R shoulder weakness.   Education / Equipment: HEP   Patient agrees to discharge. Patient goals were met or partially met. Patient is being discharged due to being pleased with the current functional level.  Elijah CHRISTELLA Hidden, PT 04/05/2023, 2:00 PM

## 2023-07-05 ENCOUNTER — Encounter: Payer: Self-pay | Admitting: Hematology and Oncology

## 2023-07-05 ENCOUNTER — Inpatient Hospital Stay: Payer: Medicare Other

## 2023-07-05 ENCOUNTER — Inpatient Hospital Stay: Payer: Medicare Other | Attending: Hematology and Oncology

## 2023-07-05 ENCOUNTER — Inpatient Hospital Stay (HOSPITAL_BASED_OUTPATIENT_CLINIC_OR_DEPARTMENT_OTHER): Payer: Medicare Other | Admitting: Hematology and Oncology

## 2023-07-05 VITALS — BP 141/83 | HR 87 | Resp 18 | Ht 63.5 in | Wt 177.8 lb

## 2023-07-05 VITALS — BP 109/68 | HR 78 | Resp 17

## 2023-07-05 DIAGNOSIS — D471 Chronic myeloproliferative disease: Secondary | ICD-10-CM

## 2023-07-05 DIAGNOSIS — R7303 Prediabetes: Secondary | ICD-10-CM

## 2023-07-05 DIAGNOSIS — D751 Secondary polycythemia: Secondary | ICD-10-CM

## 2023-07-05 LAB — CBC WITH DIFFERENTIAL/PLATELET
Abs Immature Granulocytes: 0.02 10*3/uL (ref 0.00–0.07)
Basophils Absolute: 0.1 10*3/uL (ref 0.0–0.1)
Basophils Relative: 1 %
Eosinophils Absolute: 0.3 10*3/uL (ref 0.0–0.5)
Eosinophils Relative: 5 %
HCT: 43.9 % (ref 36.0–46.0)
Hemoglobin: 15.1 g/dL — ABNORMAL HIGH (ref 12.0–15.0)
Immature Granulocytes: 0 %
Lymphocytes Relative: 23 %
Lymphs Abs: 1.3 10*3/uL (ref 0.7–4.0)
MCH: 28.9 pg (ref 26.0–34.0)
MCHC: 34.4 g/dL (ref 30.0–36.0)
MCV: 83.9 fL (ref 80.0–100.0)
Monocytes Absolute: 0.5 10*3/uL (ref 0.1–1.0)
Monocytes Relative: 9 %
Neutro Abs: 3.5 10*3/uL (ref 1.7–7.7)
Neutrophils Relative %: 62 %
Platelets: 194 10*3/uL (ref 150–400)
RBC: 5.23 MIL/uL — ABNORMAL HIGH (ref 3.87–5.11)
RDW: 13.7 % (ref 11.5–15.5)
WBC: 5.7 10*3/uL (ref 4.0–10.5)
nRBC: 0 % (ref 0.0–0.2)

## 2023-07-05 LAB — FERRITIN: Ferritin: 101 ng/mL (ref 11–307)

## 2023-07-05 NOTE — Assessment & Plan Note (Addendum)
 Her hemoglobin fluctuates up and down Based on my review, I anticipate she will need phlebotomy every 3-4 months I will place threshold for phlebotomy at 15 She will receive phlebotomy today

## 2023-07-05 NOTE — Progress Notes (Signed)
 [facility] OFFICE PROGRESS NOTE  Patient Care Team: Eather Colas, FNP as PCP - General (Nurse Practitioner) Ihor Austin, NP as Nurse Practitioner (Neurology)  Assessment & Plan MPN (myeloproliferative neoplasm) [facility]) Her hemoglobin fluctuates up and down Based on my review, I anticipate she will need phlebotomy every 3-4 months I will place threshold for phlebotomy at 15 She will receive phlebotomy today  Iron overload She has evidence of iron overload Genetic test ruled out inheritable hemochromatosis We discussed the role of phlebotomy and she agreed Repeat ferritin level is pending Pre-diabetes She requested hemoglobin A1c I told her her A1c will be low due to phlebotomy  No orders of the defined types were placed in this encounter.    Artis Delay, MD  INTERVAL HISTORY: she returns for surveillance follow-up for history of iron overload and elevated hemoglobin She felt better recently She thinks she felt better after phlebotomy visits She is questioning whether she should get hemoglobin A1c checked today  PHYSICAL EXAMINATION: ECOG PERFORMANCE STATUS: 0 - Asymptomatic  Vitals:   07/05/23 1225  BP: (!) 141/83  Pulse: 87  Resp: 18  SpO2: 95%   Filed Weights   07/05/23 1225  Weight: 177 lb 12.8 oz (80.6 kg)    Relevant data reviewed during this visit included CBC  SUMMARY OF HEMATOLOGIC HISTORY:  She had a stroke approximately a year and a half ago and at the time of evaluation/admission, she was noted to have high hemoglobin The patient had 50+ pack year of smoking, only quit since the stroke diagnosis Since her history of stroke, she has residual dizziness, visual difficulties and balance problems.  She denies recent falls She had extensive evaluation by her primary care doctor recently and was noted to have several abnormal blood work including persistent polycythemia as well as abnormal iron studies.  She was also noted to have abnormal  liver function and had ultrasound evaluation.  Iron studies was noted to be grossly abnormal  She denies intermittent headaches, shortness of breath on exertion, frequent leg cramps or chest pain.   There is no prior diagnosis of obstructive sleep apnea. The patient denies weight loss or skin itching. 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

## 2023-07-05 NOTE — Patient Instructions (Signed)

## 2023-07-05 NOTE — Progress Notes (Signed)
 Monica Rivers presents today for phlebotomy per MD orders. Phlebotomy procedure started at 1256 and ended at 47. Left AC phlebotomy kit used. 435 grams removed. Patient declined 30 minute observation.  Patient tolerated procedure well. IV needle removed intact.

## 2023-07-05 NOTE — Assessment & Plan Note (Addendum)
 She has evidence of iron overload Genetic test ruled out inheritable hemochromatosis We discussed the role of phlebotomy and she agreed Repeat ferritin level is pending

## 2023-07-05 NOTE — Assessment & Plan Note (Addendum)
 She requested hemoglobin A1c I told her her A1c will be low due to phlebotomy

## 2023-10-04 ENCOUNTER — Inpatient Hospital Stay: Attending: Hematology and Oncology | Admitting: Hematology and Oncology

## 2023-10-04 ENCOUNTER — Encounter: Payer: Self-pay | Admitting: Hematology and Oncology

## 2023-10-04 ENCOUNTER — Inpatient Hospital Stay

## 2023-10-04 VITALS — BP 132/60 | HR 77 | Temp 97.7°F | Resp 18 | Ht 63.5 in | Wt 172.2 lb

## 2023-10-04 VITALS — BP 116/59 | HR 73 | Resp 16

## 2023-10-04 DIAGNOSIS — D471 Chronic myeloproliferative disease: Secondary | ICD-10-CM | POA: Diagnosis present

## 2023-10-04 DIAGNOSIS — D751 Secondary polycythemia: Secondary | ICD-10-CM

## 2023-10-04 LAB — CBC WITH DIFFERENTIAL/PLATELET
Abs Immature Granulocytes: 0.01 K/uL (ref 0.00–0.07)
Basophils Absolute: 0.1 K/uL (ref 0.0–0.1)
Basophils Relative: 1 %
Eosinophils Absolute: 0.4 K/uL (ref 0.0–0.5)
Eosinophils Relative: 6 %
HCT: 46 % (ref 36.0–46.0)
Hemoglobin: 15.8 g/dL — ABNORMAL HIGH (ref 12.0–15.0)
Immature Granulocytes: 0 %
Lymphocytes Relative: 23 %
Lymphs Abs: 1.4 K/uL (ref 0.7–4.0)
MCH: 29.1 pg (ref 26.0–34.0)
MCHC: 34.3 g/dL (ref 30.0–36.0)
MCV: 84.7 fL (ref 80.0–100.0)
Monocytes Absolute: 0.6 K/uL (ref 0.1–1.0)
Monocytes Relative: 10 %
Neutro Abs: 3.6 K/uL (ref 1.7–7.7)
Neutrophils Relative %: 60 %
Platelets: 192 K/uL (ref 150–400)
RBC: 5.43 MIL/uL — ABNORMAL HIGH (ref 3.87–5.11)
RDW: 13.4 % (ref 11.5–15.5)
WBC: 6.1 K/uL (ref 4.0–10.5)
nRBC: 0 % (ref 0.0–0.2)

## 2023-10-04 NOTE — Patient Instructions (Signed)

## 2023-10-04 NOTE — Progress Notes (Signed)
 [facility] OFFICE PROGRESS NOTE  Patient Care Team: Katrinka Duwaine LABOR, FNP as PCP - General (Nurse Practitioner) Whitfield Raisin, NP as Nurse Practitioner (Neurology)  Assessment & Plan MPN (myeloproliferative neoplasm) [facility]) Her hemoglobin fluctuates up and down We discussed role of hydroxyurea  but she declined Based on my review, I anticipate she will need phlebotomy every 3-4 months I will place threshold for phlebotomy at 15 She will receive phlebotomy today   No orders of the defined types were placed in this encounter.    Almarie Bedford, MD  INTERVAL HISTORY: she returns for surveillance follow-up for myeloproliferative disorder/neoplasm Patient denies recent bleeding such as epistaxis, hematuria or hematochezia No recent infection We reviewed medication list and discussed medication changes We discussed test results and future plan of care as outlined above  PHYSICAL EXAMINATION: ECOG PERFORMANCE STATUS: 0 - Asymptomatic  Vitals:   10/04/23 1214  BP: 132/60  Pulse: 77  Resp: 18  Temp: 97.7 F (36.5 C)  SpO2: 97%   Lab Results  Component Value Date   WBC 6.1 10/04/2023   HGB 15.8 (H) 10/04/2023   HCT 46.0 10/04/2023   MCV 84.7 10/04/2023   PLT 192 10/04/2023

## 2023-10-04 NOTE — Progress Notes (Signed)
 Patient qualified for her phlebotomy today with a hemoglobin of 15.8. Procedure started at 1250- after trying both elbow accesses- a total of 286 was removed. Unable to remove entire 500g ordered. Discussed with Dr. Lonn -no need to proceed with another access. Patient stable, after about 10 minutes patient decided top leave. She took some water to drink.   VSS-  BP (!) 116/59 (BP Location: Left Arm, Patient Position: Sitting)   Pulse 73   Resp 16   SpO2 97%   Ambulatory lobby to the with cane- husband present to drive.

## 2023-10-04 NOTE — Assessment & Plan Note (Addendum)
 Her hemoglobin fluctuates up and down We discussed role of hydroxyurea  but she declined Based on my review, I anticipate she will need phlebotomy every 3-4 months I will place threshold for phlebotomy at 15 She will receive phlebotomy today

## 2024-01-04 ENCOUNTER — Inpatient Hospital Stay: Admitting: Hematology and Oncology

## 2024-01-04 ENCOUNTER — Inpatient Hospital Stay

## 2024-01-17 ENCOUNTER — Inpatient Hospital Stay: Attending: Hematology and Oncology

## 2024-01-17 ENCOUNTER — Inpatient Hospital Stay (HOSPITAL_BASED_OUTPATIENT_CLINIC_OR_DEPARTMENT_OTHER): Admitting: Hematology and Oncology

## 2024-01-17 ENCOUNTER — Encounter: Payer: Self-pay | Admitting: Hematology and Oncology

## 2024-01-17 ENCOUNTER — Inpatient Hospital Stay

## 2024-01-17 VITALS — BP 147/78 | HR 89 | Temp 98.0°F | Resp 18 | Ht 63.5 in | Wt 173.2 lb

## 2024-01-17 VITALS — BP 131/79 | HR 79 | Temp 97.7°F | Resp 18

## 2024-01-17 DIAGNOSIS — D751 Secondary polycythemia: Secondary | ICD-10-CM

## 2024-01-17 DIAGNOSIS — D471 Chronic myeloproliferative disease: Secondary | ICD-10-CM

## 2024-01-17 LAB — CBC WITH DIFFERENTIAL/PLATELET
Abs Immature Granulocytes: 0.01 K/uL (ref 0.00–0.07)
Basophils Absolute: 0.1 K/uL (ref 0.0–0.1)
Basophils Relative: 1 %
Eosinophils Absolute: 0.3 K/uL (ref 0.0–0.5)
Eosinophils Relative: 5 %
HCT: 46.8 % — ABNORMAL HIGH (ref 36.0–46.0)
Hemoglobin: 15.8 g/dL — ABNORMAL HIGH (ref 12.0–15.0)
Immature Granulocytes: 0 %
Lymphocytes Relative: 23 %
Lymphs Abs: 1.2 K/uL (ref 0.7–4.0)
MCH: 27.5 pg (ref 26.0–34.0)
MCHC: 33.8 g/dL (ref 30.0–36.0)
MCV: 81.5 fL (ref 80.0–100.0)
Monocytes Absolute: 0.5 K/uL (ref 0.1–1.0)
Monocytes Relative: 10 %
Neutro Abs: 3 K/uL (ref 1.7–7.7)
Neutrophils Relative %: 61 %
Platelets: 173 K/uL (ref 150–400)
RBC: 5.74 MIL/uL — ABNORMAL HIGH (ref 3.87–5.11)
RDW: 13.7 % (ref 11.5–15.5)
WBC: 5.1 K/uL (ref 4.0–10.5)
nRBC: 0 % (ref 0.0–0.2)

## 2024-01-17 NOTE — Progress Notes (Signed)
 [facility] OFFICE PROGRESS NOTE  Patient Care Team: Katrinka Duwaine LABOR, FNP as PCP - General (Nurse Practitioner) Whitfield Raisin, NP as Nurse Practitioner (Neurology)  Assessment & Plan MPN (myeloproliferative neoplasm) [facility]) Her hemoglobin is staying high We discussed role of hydroxyurea  but she declined Based on my review, I anticipate she will need phlebotomy every 3-4 months I will place threshold for phlebotomy at 15 She will receive phlebotomy today   No orders of the defined types were placed in this encounter.    Almarie Bedford, MD  INTERVAL HISTORY: she returns for surveillance follow-up for myeloproliferative disorder/neoplasm Patient denies recent bleeding such as epistaxis, hematuria or hematochezia No recent infection We discussed test results and future plan of care as outlined above  PHYSICAL EXAMINATION: ECOG PERFORMANCE STATUS: 0 - Asymptomatic  Vitals:   01/17/24 1120  BP: (!) 147/78  Pulse: 89  Resp: 18  Temp: 98 F (36.7 C)  SpO2: 96%   Lab Results  Component Value Date   WBC 5.1 01/17/2024   HGB 15.8 (H) 01/17/2024   HCT 46.8 (H) 01/17/2024   MCV 81.5 01/17/2024   PLT 173 01/17/2024

## 2024-01-17 NOTE — Assessment & Plan Note (Addendum)
 Her hemoglobin is staying high We discussed role of hydroxyurea  but she declined Based on my review, I anticipate she will need phlebotomy every 3-4 months I will place threshold for phlebotomy at 15 She will receive phlebotomy today

## 2024-01-17 NOTE — Progress Notes (Signed)
 Monica Rivers presents today for phlebotomy per MD orders. Phlebotomy procedure started at 1215 and ended at 1225. 508 grams removed. Patient observed for 15 minutes after procedure without any incident. Patient tolerated procedure well. IV needle removed intact.

## 2024-01-17 NOTE — Patient Instructions (Signed)

## 2024-04-17 ENCOUNTER — Inpatient Hospital Stay: Attending: Hematology and Oncology

## 2024-04-17 ENCOUNTER — Inpatient Hospital Stay

## 2024-04-17 ENCOUNTER — Inpatient Hospital Stay (HOSPITAL_BASED_OUTPATIENT_CLINIC_OR_DEPARTMENT_OTHER): Admitting: Hematology and Oncology

## 2024-04-17 VITALS — BP 151/62 | HR 68 | Temp 97.8°F | Resp 18 | Ht 63.5 in | Wt 173.4 lb

## 2024-04-17 DIAGNOSIS — D471 Chronic myeloproliferative disease: Secondary | ICD-10-CM

## 2024-04-17 DIAGNOSIS — D751 Secondary polycythemia: Secondary | ICD-10-CM

## 2024-04-17 LAB — CBC WITH DIFFERENTIAL/PLATELET
Abs Immature Granulocytes: 0.01 K/uL (ref 0.00–0.07)
Basophils Absolute: 0 K/uL (ref 0.0–0.1)
Basophils Relative: 1 %
Eosinophils Absolute: 0.3 K/uL (ref 0.0–0.5)
Eosinophils Relative: 5 %
HCT: 46 % (ref 36.0–46.0)
Hemoglobin: 15.3 g/dL — ABNORMAL HIGH (ref 12.0–15.0)
Immature Granulocytes: 0 %
Lymphocytes Relative: 23 %
Lymphs Abs: 1.3 K/uL (ref 0.7–4.0)
MCH: 26.3 pg (ref 26.0–34.0)
MCHC: 33.3 g/dL (ref 30.0–36.0)
MCV: 79.2 fL — ABNORMAL LOW (ref 80.0–100.0)
Monocytes Absolute: 0.6 K/uL (ref 0.1–1.0)
Monocytes Relative: 11 %
Neutro Abs: 3.4 K/uL (ref 1.7–7.7)
Neutrophils Relative %: 60 %
Platelets: 195 K/uL (ref 150–400)
RBC: 5.81 MIL/uL — ABNORMAL HIGH (ref 3.87–5.11)
RDW: 15.5 % (ref 11.5–15.5)
WBC: 5.6 K/uL (ref 4.0–10.5)
nRBC: 0 % (ref 0.0–0.2)

## 2024-04-17 NOTE — Progress Notes (Signed)
 Monica Rivers presents today for phlebotomy per MD orders. Phlebotomy procedure started at 1305 and ended at 1313. 518 grams removed. Patient observed for 30 minutes after procedure without any incident. Patient tolerated procedure well. IV needle removed intact.

## 2024-04-18 ENCOUNTER — Encounter: Payer: Self-pay | Admitting: Hematology and Oncology

## 2024-04-18 NOTE — Assessment & Plan Note (Addendum)
 The patient likely has myeloproliferative disorder based on previous abnormal NGS panel Her hemoglobin is staying high We discussed role of hydroxyurea  but she declined Based on my review, I anticipate she will need phlebotomy every 3-4 months I will place threshold for phlebotomy at 15 She will receive phlebotomy today

## 2024-04-18 NOTE — Progress Notes (Signed)
 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    Almarie Bedford, MD  INTERVAL HISTORY: she returns for surveillance follow-up for myeloproliferative disorder/neoplasm Patient denies recent bleeding such as epistaxis, hematuria or hematochezia No recent infection We reviewed medication list and discussed medication changes We discussed test results and future plan of care as outlined above  PHYSICAL EXAMINATION: ECOG PERFORMANCE STATUS: 0 - Asymptomatic  Vitals:   04/17/24 1253  BP: (!) 151/62  Pulse: 68  Resp: 18  Temp: 97.8 F (36.6 C)  SpO2: 97%   Lab Results  Component Value Date   WBC 5.6 04/17/2024   HGB 15.3 (H) 04/17/2024   HCT 46.0 04/17/2024   MCV 79.2 (L) 04/17/2024   PLT 195 04/17/2024

## 2024-07-18 ENCOUNTER — Inpatient Hospital Stay

## 2024-07-18 ENCOUNTER — Inpatient Hospital Stay: Attending: Hematology and Oncology

## 2024-07-18 ENCOUNTER — Inpatient Hospital Stay: Admitting: Hematology and Oncology
